# Patient Record
Sex: Male | Born: 1940 | Race: White | Hispanic: No | Marital: Married | Smoking: Former smoker
Health system: Southern US, Community
[De-identification: ages and names within clinical notes are randomized; demographics above are authoritative.]

## PROBLEM LIST (undated history)

## (undated) DIAGNOSIS — Z860100 Personal history of colon polyps, unspecified: Secondary | ICD-10-CM

## (undated) DIAGNOSIS — M109 Gout, unspecified: Secondary | ICD-10-CM

## (undated) DIAGNOSIS — E785 Hyperlipidemia, unspecified: Secondary | ICD-10-CM

## (undated) DIAGNOSIS — E119 Type 2 diabetes mellitus without complications: Secondary | ICD-10-CM

## (undated) DIAGNOSIS — I2510 Atherosclerotic heart disease of native coronary artery without angina pectoris: Secondary | ICD-10-CM

## (undated) DIAGNOSIS — I219 Acute myocardial infarction, unspecified: Secondary | ICD-10-CM

## (undated) DIAGNOSIS — Z9889 Other specified postprocedural states: Secondary | ICD-10-CM

## (undated) DIAGNOSIS — M1990 Unspecified osteoarthritis, unspecified site: Secondary | ICD-10-CM

## (undated) DIAGNOSIS — G4730 Sleep apnea, unspecified: Secondary | ICD-10-CM

## (undated) DIAGNOSIS — C609 Malignant neoplasm of penis, unspecified: Secondary | ICD-10-CM

## (undated) DIAGNOSIS — R7303 Prediabetes: Secondary | ICD-10-CM

## (undated) DIAGNOSIS — N139 Obstructive and reflux uropathy, unspecified: Secondary | ICD-10-CM

## (undated) DIAGNOSIS — J45909 Unspecified asthma, uncomplicated: Secondary | ICD-10-CM

## (undated) DIAGNOSIS — F419 Anxiety disorder, unspecified: Secondary | ICD-10-CM

## (undated) DIAGNOSIS — R0600 Dyspnea, unspecified: Secondary | ICD-10-CM

## (undated) DIAGNOSIS — D649 Anemia, unspecified: Secondary | ICD-10-CM

## (undated) DIAGNOSIS — J3489 Other specified disorders of nose and nasal sinuses: Secondary | ICD-10-CM

## (undated) DIAGNOSIS — M130 Polyarthritis, unspecified: Secondary | ICD-10-CM

## (undated) DIAGNOSIS — I10 Essential (primary) hypertension: Secondary | ICD-10-CM

## (undated) HISTORY — PX: CARPAL TUNNEL RELEASE: SHX101

## (undated) HISTORY — PX: COLONOSCOPY: SHX174

## (undated) HISTORY — PX: TONSILLECTOMY: SUR1361

## (undated) HISTORY — DX: Malignant neoplasm of penis, unspecified: C60.9

## (undated) HISTORY — PX: GANGLION CYST EXCISION: SHX1691

## (undated) HISTORY — DX: Type 2 diabetes mellitus without complications: E11.9

## (undated) HISTORY — PX: CARDIAC CATHETERIZATION: SHX172

## (undated) HISTORY — PX: ROTATOR CUFF REPAIR: SHX139

## (undated) HISTORY — PX: ESOPHAGOGASTRODUODENOSCOPY: SHX1529

## (undated) HISTORY — PX: APPENDECTOMY: SHX54

## (undated) NOTE — *Deleted (*Deleted)
11/30/2019  No chief complaint on file.    Jared Freeman is a 31 y.o. male with refractory urinary symptoms related to BPH who presents today to the office for cystoscopy and prostate sizing   Please see previous notes for details.     There were no vitals taken for this visit. NED. A&Ox3.   No respiratory distress   Abd soft, NT, ND Normal phallus with bilateral descended testicles    Cystoscopy Procedure Note  Patient identification was confirmed, informed consent was obtained, and patient was prepped using Betadine solution.  Lidocaine jelly was administered per urethral meatus.    Preoperative abx where received prior to procedure.     Pre-Procedure: - Inspection reveals a normal caliber ureteral meatus.  Procedure: The flexible cystoscope was introduced without difficulty - No urethral strictures/lesions are present. - {Blank multiple:19197::"Enlarged","Surgically absent","Normal"} prostate *** - {Blank multiple:19197::"Normal","Elevated","Tight"} bladder neck - Bilateral ureteral orifices identified - Bladder mucosa  reveals no ulcers, tumors, or lesions - No bladder stones - No trabeculation  Retroflexion shows ***   Post-Procedure: - Patient tolerated the procedure well   Prostate transrectal ultrasound sizing   Informed consent was obtained after discussing risks/benefits of the procedure.  A time out was performed to ensure correct patient identity.   Pre-Procedure: -Transrectal probe was placed without difficulty -Transrectal Ultrasound performed revealing a *** gm prostate measuring *** x *** x *** cm (length) -No significant hypoechoic or median lobe noted      Assessment/ Plan:   I, Theador Hawthorne, am acting as a scribe for Dr. Vanna Scotland.  {Add Production assistant, radio Statement}

---

## 2004-01-19 ENCOUNTER — Ambulatory Visit: Payer: Self-pay | Admitting: Orthopedic Surgery

## 2004-02-16 ENCOUNTER — Ambulatory Visit: Payer: Self-pay | Admitting: Unknown Physician Specialty

## 2004-10-26 ENCOUNTER — Ambulatory Visit: Payer: Self-pay | Admitting: General Practice

## 2006-12-17 ENCOUNTER — Ambulatory Visit: Payer: Self-pay | Admitting: Orthopedic Surgery

## 2006-12-17 ENCOUNTER — Other Ambulatory Visit: Payer: Self-pay

## 2006-12-24 ENCOUNTER — Ambulatory Visit: Payer: Self-pay | Admitting: Orthopedic Surgery

## 2008-01-11 ENCOUNTER — Ambulatory Visit: Payer: Self-pay | Admitting: Unknown Physician Specialty

## 2009-03-31 ENCOUNTER — Ambulatory Visit: Payer: Self-pay | Admitting: Unknown Physician Specialty

## 2011-05-13 ENCOUNTER — Inpatient Hospital Stay: Payer: Self-pay | Admitting: Surgery

## 2011-05-13 LAB — URINALYSIS, COMPLETE
Blood: NEGATIVE
Hyaline Cast: 1
Ketone: NEGATIVE
Leukocyte Esterase: NEGATIVE
Protein: NEGATIVE
RBC,UR: 1 /HPF (ref 0–5)
Squamous Epithelial: NONE SEEN

## 2011-05-13 LAB — COMPREHENSIVE METABOLIC PANEL
Albumin: 4.4 g/dL (ref 3.4–5.0)
Alkaline Phosphatase: 71 U/L (ref 50–136)
BUN: 17 mg/dL (ref 7–18)
Bilirubin,Total: 0.8 mg/dL (ref 0.2–1.0)
Chloride: 105 mmol/L (ref 98–107)
Co2: 27 mmol/L (ref 21–32)
Creatinine: 1.32 mg/dL — ABNORMAL HIGH (ref 0.60–1.30)
EGFR (African American): >60
EGFR (Non-African Amer.): 57 — ABNORMAL LOW
Glucose: 134 mg/dL — ABNORMAL HIGH (ref 65–99)
Osmolality: 288 (ref 275–301)
Potassium: 4.1 mmol/L (ref 3.5–5.1)
SGOT(AST): 27 U/L (ref 15–37)
SGPT (ALT): 30 U/L
Sodium: 143 mmol/L (ref 136–145)
Total Protein: 7.9 g/dL (ref 6.4–8.2)

## 2011-05-13 LAB — PROTIME-INR: Prothrombin Time: 13.8 secs (ref 11.5–14.7)

## 2011-05-13 LAB — CBC
HCT: 50.9 % (ref 40.0–52.0)
HGB: 16.9 g/dL (ref 13.0–18.0)
MCH: 32.5 pg (ref 26.0–34.0)
MCHC: 33.1 g/dL (ref 32.0–36.0)
MCV: 98 fL (ref 80–100)
RBC: 5.18 10*6/uL (ref 4.40–5.90)
RDW: 15.2 % — ABNORMAL HIGH (ref 11.5–14.5)
WBC: 14.4 10*3/uL — ABNORMAL HIGH (ref 3.8–10.6)

## 2011-05-14 LAB — BASIC METABOLIC PANEL
EGFR (Non-African Amer.): 47 — ABNORMAL LOW
Glucose: 260 mg/dL — ABNORMAL HIGH (ref 65–99)
Osmolality: 290 (ref 275–301)
Potassium: 4.4 mmol/L (ref 3.5–5.1)
Sodium: 140 mmol/L (ref 136–145)

## 2011-05-14 LAB — CBC WITH DIFFERENTIAL/PLATELET
Basophil %: 0 %
Eosinophil #: 0 10*3/uL (ref 0.0–0.7)
Eosinophil %: 0 %
HCT: 42.5 % (ref 40.0–52.0)
HGB: 13.9 g/dL (ref 13.0–18.0)
Lymphocyte #: 0.4 10*3/uL — ABNORMAL LOW (ref 1.0–3.6)
Lymphocyte %: 3.2 %
MCH: 32.2 pg (ref 26.0–34.0)
MCHC: 32.7 g/dL (ref 32.0–36.0)
MCV: 98 fL (ref 80–100)
Monocyte #: 0.3 10*3/uL (ref 0.0–0.7)
Neutrophil #: 12.6 10*3/uL — ABNORMAL HIGH (ref 1.4–6.5)
Neutrophil %: 94.4 %
RBC: 4.32 10*6/uL — ABNORMAL LOW (ref 4.40–5.90)
WBC: 13.4 10*3/uL — ABNORMAL HIGH (ref 3.8–10.6)

## 2011-05-15 LAB — CBC WITH DIFFERENTIAL/PLATELET
Basophil #: 0 10*3/uL (ref 0.0–0.1)
Basophil %: 0 %
Eosinophil #: 0 10*3/uL (ref 0.0–0.7)
Eosinophil %: 0 %
HCT: 41.4 % (ref 40.0–52.0)
Lymphocyte %: 9.3 %
MCH: 32.3 pg (ref 26.0–34.0)
MCV: 98 fL (ref 80–100)
Monocyte #: 0.8 10*3/uL — ABNORMAL HIGH (ref 0.0–0.7)
Monocyte %: 6.4 %
Neutrophil %: 84.3 %
WBC: 12.1 10*3/uL — ABNORMAL HIGH (ref 3.8–10.6)

## 2011-05-30 ENCOUNTER — Ambulatory Visit: Payer: Self-pay | Admitting: Family Medicine

## 2012-02-12 DIAGNOSIS — C609 Malignant neoplasm of penis, unspecified: Secondary | ICD-10-CM

## 2012-02-12 HISTORY — DX: Malignant neoplasm of penis, unspecified: C60.9

## 2012-02-12 HISTORY — PX: PENILE BIOPSY: SHX6013

## 2012-10-27 ENCOUNTER — Ambulatory Visit: Payer: Self-pay | Admitting: Cardiology

## 2013-01-01 DIAGNOSIS — C609 Malignant neoplasm of penis, unspecified: Secondary | ICD-10-CM | POA: Insufficient documentation

## 2013-08-23 ENCOUNTER — Ambulatory Visit: Payer: Self-pay

## 2013-08-23 LAB — RAPID STREP-A WITH REFLX: MICRO TEXT REPORT: NEGATIVE

## 2013-08-25 LAB — BETA STREP CULTURE(ARMC)

## 2013-08-27 DIAGNOSIS — G4730 Sleep apnea, unspecified: Secondary | ICD-10-CM | POA: Insufficient documentation

## 2013-08-27 DIAGNOSIS — Z86010 Personal history of colonic polyps: Secondary | ICD-10-CM | POA: Insufficient documentation

## 2013-08-27 DIAGNOSIS — M109 Gout, unspecified: Secondary | ICD-10-CM | POA: Insufficient documentation

## 2013-08-27 DIAGNOSIS — N139 Obstructive and reflux uropathy, unspecified: Secondary | ICD-10-CM | POA: Insufficient documentation

## 2013-08-27 DIAGNOSIS — Z8639 Personal history of other endocrine, nutritional and metabolic disease: Secondary | ICD-10-CM | POA: Insufficient documentation

## 2013-08-27 DIAGNOSIS — I739 Peripheral vascular disease, unspecified: Secondary | ICD-10-CM | POA: Insufficient documentation

## 2013-08-27 DIAGNOSIS — E669 Obesity, unspecified: Secondary | ICD-10-CM | POA: Insufficient documentation

## 2013-08-27 DIAGNOSIS — E119 Type 2 diabetes mellitus without complications: Secondary | ICD-10-CM

## 2013-08-27 DIAGNOSIS — I493 Ventricular premature depolarization: Secondary | ICD-10-CM | POA: Insufficient documentation

## 2013-08-27 DIAGNOSIS — Z8679 Personal history of other diseases of the circulatory system: Secondary | ICD-10-CM | POA: Insufficient documentation

## 2013-08-27 DIAGNOSIS — I10 Essential (primary) hypertension: Secondary | ICD-10-CM | POA: Insufficient documentation

## 2013-08-27 DIAGNOSIS — Z9889 Other specified postprocedural states: Secondary | ICD-10-CM | POA: Insufficient documentation

## 2013-08-27 HISTORY — DX: Type 2 diabetes mellitus without complications: E11.9

## 2013-11-01 ENCOUNTER — Ambulatory Visit: Payer: Self-pay | Admitting: Physician Assistant

## 2013-12-28 ENCOUNTER — Ambulatory Visit: Payer: Self-pay | Admitting: Internal Medicine

## 2014-02-13 ENCOUNTER — Ambulatory Visit: Payer: Self-pay | Admitting: Internal Medicine

## 2014-02-20 ENCOUNTER — Ambulatory Visit: Payer: Self-pay | Admitting: Physician Assistant

## 2014-04-14 ENCOUNTER — Ambulatory Visit: Payer: Self-pay | Admitting: Vascular Surgery

## 2014-06-05 NOTE — Op Note (Signed)
PATIENT NAME:  Jared Freeman, Jared Freeman MR#:  [id number] DATE OF BIRTH:  [birth year]  DATE OF PROCEDURE:  05/13/2011  PREOPERATIVE DIAGNOSIS: Acute appendicitis.   POSTOPERATIVE DIAGNOSIS: Acute gangrenous appendicitis.   PROCEDURE: Laparoscopic appendectomy.   SURGEON: Phoebe Perch, M.D.   ANESTHESIA: General with endotracheal tube - Dr. Kayleen Memos.   INDICATIONS: This is a patient with probable acute appendicitis. Jared Freeman has right upper quadrant pain, CT findings suggestive of appendicitis, and a leukocytosis. Jared Freeman also has signs of peritoneal irritation. Preoperatively we discussed rationale for surgery, the options of observation, risk of bleeding, infection, recurrence of symptoms, failure to resolve the symptoms, negative laparoscopy and conversion to an open procedure as well as bowel injury. This was all reviewed for Jared Freeman and his family in the preop holding area. They understood and agreed to proceed.   FINDINGS: Acute gangrenous appendicitis in a retrocecal position high in the right upper quadrant. His morbid obesity made his dissection very difficult necessitating a fourth port, in the right upper quadrant.   DESCRIPTION OF PROCEDURE: The patient was induced to general anesthesia and given IV antibiotics. VTE prophylaxis was in place. Jared Freeman was prepped and draped in a sterile fashion. Marcaine was infiltrated in the skin and subcutaneous tissues around the infraumbilical area avoiding the umbilical hernia and a Veress needle was placed. Pneumoperitoneum was obtained. A 5 mm trocar port was placed. The abdominal cavity was explored and under direct vision a 5 mm suprapubic port and a 13 mm left lateral port was placed, all under direct vision. An attempt at reaching the appendix was performed and search for the appendix was performed showing that there was fibropurulent exudate in the right upper quadrant. The left lateral port site would barely reach the right upper quadrant and dissection necessitated  placement of a fourth 5 mm port, in the right upper quadrant for retraction. The appendix was ultimately identified in a somewhat retrocecal position. It was gangrenous and purulent. Photos were taken of it exuding its purulence prior to much manipulation. The base of the appendix was identified and handled with an Endo GIA standard load and then the mesoappendix was elevated and with some difficulty it was handled with two firings of a vascular load Endo GIA. The specimen was passed out through the lateral port site with the aid of an Endo Catch bag. The area was checked for hemostasis. The staple line seemed to be bleeding in one area and a small arterial bleed on the staple line was handled with double clips.   The area was checked for hemostasis and found to be adequate. There was no sign of bowel injury and there were no signs of adhesions. Irrigation was performed and aspiration was performed. Again, hemostasis was adequate.   The patient, because of this difficult location and his morbid obesity, required extraordinary positioning head down, left lateral manipulations in order to perform this difficult dissection.  Ultimately there was no sign of bleeding. The left lateral port site was then closed with multiple simple sutures of 0 Vicryl utilizing an Endo Close technique. Hemostasis was again checked at the end of the procedure and found to be adequate. The sponge, lap and needle counts were correct. Pneumoperitoneum was released. All ports were removed. 4-0 subcuticular Monocryl was used on all skin edges. Steri-Strips, Mastisol, and sterile dressings were placed. A total of 30 mL of Marcaine was utilized. The patient tolerated this procedure well. Jared Freeman was taken to the recovery room in stable condition to be admitted for  continued care. ____________________________ Jerrol Banana. Burt Knack, MD rec:slb D: 05/13/2011 18:02:03 ET T: 05/14/2011 09:50:33 ET JOB#: [id number]  cc: Jerrol Banana. Burt Knack, MD,  <Dictator> Florene Glen MD ELECTRONICALLY SIGNED 05/14/2011 12:31

## 2014-06-05 NOTE — H&P (Signed)
Subjective/Chief Complaint rlq pain    History of Present Illness 24 hrs rlq pain with nausea, no emesis no f/c no prior episode, worsening    Past History CAD, MI, stents, not on Plavix last stress test 4 years ago gout, hypercholesterol PSH, hand ortho no abd surgeries    Past Medical Health Coronary Artery Disease, Hypertension   Past Med/Surgical Hx:  Hypercholesterolemia:   gout:   MI:   HTN:   ALLERGIES:  No Known Allergies:   Family and Social History:   Family History Non-Contributory    Social History negative tobacco, negative ETOH, drives    Place of Living Home   Review of Systems:   Fever/Chills No    Cough No    Abdominal Pain Yes    Diarrhea No    Constipation No    Nausea/Vomiting No    SOB/DOE No    Chest Pain No    Dysuria No    Tolerating Diet Yes   Physical Exam:   GEN NAD    HEENT pink conjunctivae    NECK supple    RESP normal resp effort  clear BS    CARD regular rate    ABD positive tenderness  soft  RLQ, pos Rossving's    LYMPH negative neck    PSYCH alert, A+O to time, place, person, good insight   Routine Hem:  01-Apr-13 11:21    WBC (CBC) 14.4   RBC (CBC) 5.18   Hemoglobin (CBC) 16.9   Hematocrit (CBC) 50.9   Platelet Count (CBC) 201   MCV 98   MCH 32.5   MCHC 33.1   RDW 15.2  Routine Chem:  01-Apr-13 11:21    Glucose, Serum 134   BUN 17   Creatinine (comp) 1.32   Sodium, Serum 143   Potassium, Serum 4.1   Chloride, Serum 105   CO2, Serum 27   Calcium (Total), Serum 9.1  Hepatic:  01-Apr-13 11:21    Bilirubin, Total 0.8   Alkaline Phosphatase 71   SGPT (ALT) 30   SGOT (AST) 27   Total Protein, Serum 7.9   Albumin, Serum 4.4  Routine Chem:  01-Apr-13 11:21    Osmolality (calc) 288   eGFR (African American) >60   eGFR (Non-African American) 57   Anion Gap 11   Lipase 538  Routine UA:  01-Apr-13 11:21    Color (UA) Yellow   Clarity (UA) Clear   Glucose (UA) Negative   Bilirubin  (UA) Negative   Ketones (UA) Negative   Specific Gravity (UA) 1.019   Blood (UA) Negative   pH (UA) 5.0   Protein (UA) Negative   Nitrite (UA) Negative   Leukocyte Esterase (UA) Negative   RBC (UA) 1 /HPF   WBC (UA) <1 /HPF   Mucous (UA) PRESENT   Hyaline Cast (UA) 1 /LPF  Routine Coag:  01-Apr-13 11:21    Prothrombin 13.8   INR 1.0   Radiology Results: CT:    01-Apr-13 15:04, CT Abdomen and Pelvis With Contrast   CT Abdomen and Pelvis With Contrast   REASON FOR EXAM:    (1) RLQ pain, leukocytosis; (2) RLQ pain,   leukocytosis;    NOTE: Nursing to Give  COMMENTS:       PROCEDURE: CT  - CT ABDOMEN / PELVIS  W  - May 13 2011  3:04PM     RESULT: Is performed with 100 mL of Isovue-300 iodinated intravenous   contrast and oral  contrast. Images are reconstructed in the axial plane   at 3.0 mm slice thickness. The patient has no previous exam for   comparison.    There is an abnormal appearance of the appendix with the diameter   measuring 1.39 cm and periappendiceal stranding present. Findings are   concerning for acute appendicitis are dominantly in the base to   midportion of the appendix. This is above the level of the iliac crest     laterally along the abdominal wall. There is no evidence of perforation   or abscess formation.    Colonic diverticular disease is present most prominently in the distal   ascending colon and sigmoid colon. There is no evidence of acute   diverticulitis. No ascites is evident in the pelvis. There is a moderate   amount of urine in the bladder. Oral contrast has not reached the colon   at the time of imaging. Contrast is seen in the distal esophagus, stomach   and small bowel. There may be some gastroesophageal reflux but there is   no significant hernia evident. The kidneys show no obstruction or   nephrolithiasis. Atherosclerotic calcification is seen within the   abdominal aorta without evidence of an aneurysm. There is slight    thickening of the proximal duodenum. Correlate for duodenitis. No   radiopaque gallstones are evident. The liver, spleen, pancreas and   adrenal glands appear unremarkable. The lung bases appear to show     dependent atelectasis bilaterally in the lower lobe regions. There is no   interstitial edema. Degenerative changes are present in the spine most   pronounced at L4-L5 with very severe degenerative disc narrowing with   vacuum disc changes and inferior L4 and superior L5 endplate sclerosis   and spurring 3    IMPRESSION:   1. Findings of acute appendicitis.  2. Cannot exclude duodenitis. Correlate clinically.  3. The sigmoid and distal descending colon diverticulosis without   evidence of acute diverticulitis.  4. Not mentioned above is a small fat filled umbilical hernia.  5. Atherosclerotic disease.(*)        Verified By: Sundra Aland, M.D., MD     Assessment/Admission Diagnosis ac appendicitis rec lap appy risks/options rev'd agrees to proceed   Electronic Signatures: Florene Glen (MD)  (Signed 01-Apr-13 16:03)  Authored: CHIEF COMPLAINT and HISTORY, PAST MEDICAL/SURGIAL HISTORY, ALLERGIES, FAMILY AND SOCIAL HISTORY, REVIEW OF SYSTEMS, PHYSICAL EXAM, LABS, Radiology, ASSESSMENT AND PLAN   Last Updated: 01-Apr-13 16:03 by Florene Glen (MD)

## 2014-06-05 NOTE — Discharge Summary (Signed)
PATIENT NAME:  Jared Freeman, Jared Freeman MR#:  [id number] DATE OF BIRTH:  05-01-1940  DATE OF ADMISSION:  05/13/2011 DATE OF DISCHARGE:  05/15/2011  DISCHARGE DIAGNOSES:  1. Gangrenous appendicitis.  2. Coronary artery disease. 3. Gout. 4. Hypercholesterolemia.   PROCEDURE: Laparoscopic appendectomy.   HISTORY OF PRESENT ILLNESS AND HOSPITAL COURSE: This is a patient who was admitted to the hospital with right flank pain, right lateral abdominal pain with CT findings and physical findings suggestive of acute appendicitis. In the operating room gangrenous appendicitis was identified in the right upper quadrant somewhat retrocecal. It required the placement of a fourth port for removal. Postoperatively he did well, is tolerating a regular diet. His Foley catheter has been removed. He is afebrile and will be discharged in stable condition to follow-up in my office in 10 days. He is given Vicodin with instructions to resume any preop medications with the exception of steroids that he was given a prescription for by Urgent Care for bronchitis. I have asked him not to take the steroids without consulting his physician again as to the need for that in the postoperative period.   ____________________________ Jerrol Banana Burt Knack, MD rec:drc D: 05/15/2011 08:26:03 ET T: 05/15/2011 14:01:09 ET JOB#: [id number]  cc: Jerrol Banana. Burt Knack, MD, <Dictator> Florene Glen MD ELECTRONICALLY SIGNED 05/15/2011 17:26

## 2014-06-05 NOTE — H&P (Signed)
PATIENT NAME:  Jared Freeman, Jared Freeman MR#:  [id number] DATE OF BIRTH:  October 22, 1940  DATE OF ADMISSION:  05/13/2011  CHIEF COMPLAINT: Right lower quadrant pain.   HISTORY OF PRESENT ILLNESS: This is a patient with approximately 24 hours of right lower quadrant pain. He stated that it started at 16:30 hours yesterday, and was doing fine without any problems prior to that. He has had no emesis, no nausea. He has not had fevers or chills and he has never had an episode like this before. He points to the right lateral side of his abdomen stating that it is worsening.    PAST MEDICAL HISTORY:  1. Coronary artery disease with a myocardial infarction in the remote past. He had stents placed many years ago. He is not on Plavix and he had a normal stress test four years ago.  2. Gout. 3. Hypercholesterolemia.   PAST SURGICAL HISTORY:  Orthopedic surgeries. Denies abdominal surgery.   ALLERGIES: None.   MEDICATIONS: Multiple, see chart.   FAMILY HISTORY: Noncontributory.   SOCIAL HISTORY: He does not smoke or drink. 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   REVIEW OF SYSTEMS: 10-system review has been performed and negative with the exception of that mentioned in the history of present illness.   PHYSICAL EXAMINATION:  GENERAL: Healthy but obese Caucasian male patient. BMI is 36, height of 66 inches, weight 223.   VITALS: Temperature 98.2, pulse 85, respirations 18, blood pressure 137/79, pain scale 9, 93% room air sat.   HEENT: No scleral icterus.   NECK: No palpable neck nodes.   CHEST: Clear to auscultation.   CARDIAC: Regular rate and rhythm.   ABDOMEN: Abdomen is soft but there is tenderness in the right lateral abdomen somewhat above McBurney's point with guarding and rebound on the right side and a positive Rovsing's sign.   GU: Exam is within normal limits.   EXTREMITIES: Without edema. Calves are nontender.   NEUROLOGIC: Grossly intact.   INTEGUMENT: No jaundice.   LABORATORY  AND RADIOLOGICAL DATA: CT scan suggests appendicitis and will be personally reviewed. Hemoglobin and hematocrit is normal. White blood cell count 14.4 and a platelet count 201. Electrolytes are within normal limits. Creatinine is 1.32.   ASSESSMENT AND PLAN: This is a patient with coronary artery disease who presents with right lower quadrant/right flank pain and physical findings and CT findings of appendicitis. He will require emergent laparoscopic appendectomy. The rationale for offering surgery has been discussed with him and his family. The options of observation have been reviewed, and the risks of bleeding, infection, recurrence, negative laparoscopy, and conversion to an open procedure have all been reviewed. He understood and agreed to proceed.  ____________________________ Jerrol Banana Burt Knack, MD rec:bjt D:  05/14/2011 49:17:91 ET          T: 05/14/2011 09:15:18 ET         JOB#: [id number]  cc: Jerrol Banana. Burt Knack, MD, <Dictator> Florene Glen MD ELECTRONICALLY SIGNED 05/14/2011 9:55

## 2014-09-30 ENCOUNTER — Encounter: Payer: Self-pay | Admitting: *Deleted

## 2014-09-30 DIAGNOSIS — M12811 Other specific arthropathies, not elsewhere classified, right shoulder: Secondary | ICD-10-CM | POA: Insufficient documentation

## 2014-09-30 DIAGNOSIS — M75101 Unspecified rotator cuff tear or rupture of right shoulder, not specified as traumatic: Secondary | ICD-10-CM

## 2014-10-03 ENCOUNTER — Encounter
Admission: RE | Disposition: A | Discharge status: Home / Self Care | Payer: Self-pay | Source: Ambulatory Visit | Attending: Unknown Physician Specialty

## 2014-10-03 ENCOUNTER — Ambulatory Visit: Payer: BLUE CROSS/BLUE SHIELD | Admitting: Anesthesiology

## 2014-10-03 ENCOUNTER — Ambulatory Visit
Admission: RE | Admit: 2014-10-03 | Discharge: 2014-10-03 | Disposition: A | Discharge status: Home / Self Care | Payer: BLUE CROSS/BLUE SHIELD | Source: Ambulatory Visit | Attending: Unknown Physician Specialty | Admitting: Unknown Physician Specialty

## 2014-10-03 ENCOUNTER — Encounter: Payer: Self-pay | Admitting: *Deleted

## 2014-10-03 DIAGNOSIS — Z1211 Encounter for screening for malignant neoplasm of colon: Secondary | ICD-10-CM | POA: Diagnosis present

## 2014-10-03 DIAGNOSIS — Z79899 Other long term (current) drug therapy: Secondary | ICD-10-CM | POA: Diagnosis not present

## 2014-10-03 DIAGNOSIS — I252 Old myocardial infarction: Secondary | ICD-10-CM | POA: Insufficient documentation

## 2014-10-03 DIAGNOSIS — I2510 Atherosclerotic heart disease of native coronary artery without angina pectoris: Secondary | ICD-10-CM | POA: Diagnosis not present

## 2014-10-03 DIAGNOSIS — M109 Gout, unspecified: Secondary | ICD-10-CM | POA: Diagnosis not present

## 2014-10-03 DIAGNOSIS — K5730 Diverticulosis of large intestine without perforation or abscess without bleeding: Secondary | ICD-10-CM | POA: Insufficient documentation

## 2014-10-03 DIAGNOSIS — Z951 Presence of aortocoronary bypass graft: Secondary | ICD-10-CM | POA: Diagnosis not present

## 2014-10-03 DIAGNOSIS — G4730 Sleep apnea, unspecified: Secondary | ICD-10-CM | POA: Insufficient documentation

## 2014-10-03 DIAGNOSIS — Z86010 Personal history of colonic polyps: Secondary | ICD-10-CM | POA: Insufficient documentation

## 2014-10-03 DIAGNOSIS — E785 Hyperlipidemia, unspecified: Secondary | ICD-10-CM | POA: Insufficient documentation

## 2014-10-03 DIAGNOSIS — D759 Disease of blood and blood-forming organs, unspecified: Secondary | ICD-10-CM | POA: Insufficient documentation

## 2014-10-03 DIAGNOSIS — I10 Essential (primary) hypertension: Secondary | ICD-10-CM | POA: Insufficient documentation

## 2014-10-03 DIAGNOSIS — K648 Other hemorrhoids: Secondary | ICD-10-CM | POA: Insufficient documentation

## 2014-10-03 DIAGNOSIS — J45909 Unspecified asthma, uncomplicated: Secondary | ICD-10-CM | POA: Insufficient documentation

## 2014-10-03 DIAGNOSIS — Z87891 Personal history of nicotine dependence: Secondary | ICD-10-CM | POA: Diagnosis not present

## 2014-10-03 HISTORY — DX: Unspecified asthma, uncomplicated: J45.909

## 2014-10-03 HISTORY — DX: Personal history of colonic polyps: Z86.010

## 2014-10-03 HISTORY — DX: Essential (primary) hypertension: I10

## 2014-10-03 HISTORY — DX: Sleep apnea, unspecified: G47.30

## 2014-10-03 HISTORY — DX: Gout, unspecified: M10.9

## 2014-10-03 HISTORY — DX: Acute myocardial infarction, unspecified: I21.9

## 2014-10-03 HISTORY — DX: Obstructive and reflux uropathy, unspecified: N13.9

## 2014-10-03 HISTORY — DX: Other specified postprocedural states: Z98.890

## 2014-10-03 HISTORY — PX: COLONOSCOPY WITH PROPOFOL: SHX5780

## 2014-10-03 HISTORY — DX: Anemia, unspecified: D64.9

## 2014-10-03 HISTORY — DX: Personal history of colon polyps, unspecified: Z86.0100

## 2014-10-03 HISTORY — DX: Atherosclerotic heart disease of native coronary artery without angina pectoris: I25.10

## 2014-10-03 HISTORY — DX: Unspecified osteoarthritis, unspecified site: M19.90

## 2014-10-03 HISTORY — DX: Hyperlipidemia, unspecified: E78.5

## 2014-10-03 HISTORY — DX: Other specified disorders of nose and nasal sinuses: J34.89

## 2014-10-03 HISTORY — DX: Polyarthritis, unspecified: M13.0

## 2014-10-03 SURGERY — COLONOSCOPY WITH PROPOFOL
Anesthesia: General

## 2014-10-03 MED ORDER — SODIUM CHLORIDE 0.9 % IV SOLN
INTRAVENOUS | Status: DC
  Administered 2014-10-03: 1000 mL via INTRAVENOUS

## 2014-10-03 MED ORDER — SODIUM CHLORIDE 0.9 % IV SOLN
INTRAVENOUS | Status: DC | PRN
  Administered 2014-10-03: 09:00:00 via INTRAVENOUS

## 2014-10-03 MED ORDER — PROPOFOL 10 MG/ML IV BOLUS
INTRAVENOUS | Status: DC | PRN
  Administered 2014-10-03: 50 mg via INTRAVENOUS

## 2014-10-03 MED ORDER — SODIUM CHLORIDE 0.9 % IV SOLN: INTRAVENOUS | Status: DC

## 2014-10-03 MED ORDER — PROPOFOL INFUSION 10 MG/ML OPTIME
INTRAVENOUS | Status: DC | PRN
  Administered 2014-10-03: 100 ug/kg/min via INTRAVENOUS

## 2014-10-03 NOTE — Op Note (Signed)
[facility] Gastroenterology Patient Name: Jared Freeman Procedure Date: 10/03/2014 8:56 AM MRN: [id number] Account #: [id number] Date of Birth: [birth year] Admit Type: Outpatient Age: 74 Room: [facility] ENDO ROOM 1 Gender: Male Note Status: Finalized Procedure:         Colonoscopy Indications:       Personal history of colonic polyps Providers:         Manya Silvas, MD Referring MD:      Leonie Douglas. Doy Hutching, MD (Referring MD) Medicines:         Propofol per Anesthesia Complications:     No immediate complications. Procedure:         Pre-Anesthesia Assessment:                    - After reviewing the risks and benefits, the patient was                     deemed in satisfactory condition to undergo the procedure.                    After obtaining informed consent, the colonoscope was                     passed under direct vision. Throughout the procedure, the                     patient's blood pressure, pulse, and oxygen saturations                     were monitored continuously. The Colonoscope was                     introduced through the anus and advanced to the the cecum,                     identified by appendiceal orifice and ileocecal valve. The                     colonoscopy was performed without difficulty. The patient                     tolerated the procedure well. The quality of the bowel                     preparation was excellent. Findings:      Multiple small-mouthed diverticula were found in the sigmoid colon and       in the descending colon.      Internal hemorrhoids were found during endoscopy. The hemorrhoids were       small and Grade I (internal hemorrhoids that do not prolapse).      The exam was otherwise without abnormality. Impression:        - Diverticulosis in the sigmoid colon and in the                     descending colon.                    - Internal hemorrhoids.                    - The examination was otherwise normal.                   - No specimens collected. Recommendation:    - Repeat colonoscopy  in 5 years for surveillance. Manya Silvas, MD 10/03/2014 9:15:36 AM This report has been signed electronically. Number of Addenda: 0 Note Initiated On: 10/03/2014 8:56 AM Scope Withdrawal Time: 0 hours 6 minutes 34 seconds  Total Procedure Duration: 0 hours 8 minutes 52 seconds       [facility]

## 2014-10-03 NOTE — Anesthesia Postprocedure Evaluation (Signed)
  Anesthesia Post-op Note  Patient: Jared Freeman  Procedure(s) Performed: Procedure(s): COLONOSCOPY WITH PROPOFOL (N/A)  Anesthesia type:General  Patient location: PACU  Post pain: Pain level controlled  Post assessment: Post-op Vital signs reviewed, Patient's Cardiovascular Status Stable, Respiratory Function Stable, Patent Airway and No signs of Nausea or vomiting  Post vital signs: Reviewed and stable  Last Vitals:  Filed Vitals:   10/03/14 0947  BP: 123/71  Pulse:   Temp:   Resp: 17    Level of consciousness: awake, alert  and patient cooperative  Complications: No apparent anesthesia complications

## 2014-10-03 NOTE — H&P (Signed)
   Primary Care Physician:  [facility], [place] Primary Gastroenterologist:  Dr. Vira Agar  Pre-Procedure History & Physical: HPI:  Jared Freeman is a 74 y.o. male is here for an colonoscopy.   Past Medical History  Diagnosis Date  . Hypertension   . Anemia   . Coronary artery disease   . Sleep apnea   . Asthma   . Arthritis   . Gout   . Hyperlipidemia   . Polyarthropathy   . Urinary outflow obstruction   . Myocardial infarction   . History of colon polyps   . Nasal septal defect   . S/P trigger finger release     bilateral    Past Surgical History  Procedure Laterality Date  . Tonsillectomy    . Rotator cuff repair    . Carpal tunnel release Bilateral   . Ganglion cyst excision    . Colonoscopy    . Esophagogastroduodenoscopy    . Appendectomy    . Cardiac catheterization      with stent    Prior to Admission medications   Medication Sig Start Date End Date Taking? Authorizing Provider  allopurinol (ZYLOPRIM) 300 MG tablet Take 300 mg by mouth daily.   Yes Historical Provider, MD  amLODipine-benazepril (LOTREL) 5-20 MG per capsule Take 1 capsule by mouth daily.   Yes Historical Provider, MD  aspirin 325 MG tablet Take 325 mg by mouth daily.   Yes Historical Provider, MD  hydroxychloroquine (PLAQUENIL) 200 MG tablet Take by mouth daily.   Yes Historical Provider, MD  rosuvastatin (CRESTOR) 5 MG tablet Take 5 mg by mouth daily.   Yes Historical Provider, MD  cilostazol (PLETAL) 100 MG tablet Take 100 mg by mouth 2 (two) times daily.    Historical Provider, MD    Allergies as of 08/10/2014  . (Not on File)    History reviewed. No pertinent family history.  Social History   Social History  . Marital Status: Married    Spouse Name: N/A  . Number of Children: N/A  . Years of Education: N/A   Occupational History  . Not on file.   Social History Main Topics  . Smoking status: Former Smoker    Types: Cigarettes  . Smokeless tobacco: Not on file  .  Alcohol Use: No  . Drug Use: Not on file  . Sexual Activity: Not on file   Other Topics Concern  . Not on file   Social History Narrative    Review of Systems: See HPI, otherwise negative ROS  Physical Exam: BP 115/81 mmHg  Pulse 63  Temp(Src) 97.2 F (36.2 C) (Tympanic)  Resp 18  Ht 5\' 5"  (1.651 m)  Wt 91.627 kg (202 lb)  BMI 33.61 kg/m2  SpO2 98% General:   Alert,  pleasant and cooperative in NAD Head:  Normocephalic and atraumatic. Neck:  Supple; no masses or thyromegaly. Lungs:  Clear throughout to auscultation.    Heart:  Regular rate and rhythm. Abdomen:  Soft, nontender and nondistended. Normal bowel sounds, without guarding, and without rebound.   Neurologic:  Alert and  oriented x4;  grossly normal neurologically.  Impression/Plan: Jared Freeman is here for an colonoscopy to be performed for personal history of colon polyps  Risks, benefits, limitations, and alternatives regarding  colonoscopy have been reviewed with the patient.  Questions have been answered.  All parties agreeable.   Gaylyn Cheers, MD  10/03/2014, 8:57 AM

## 2014-10-03 NOTE — Anesthesia Preprocedure Evaluation (Signed)
Anesthesia Evaluation  Patient identified by MRN, date of birth, ID band Patient awake    Reviewed: Allergy & Precautions, NPO status , Patient's Chart, lab work & pertinent test results  History of Anesthesia Complications Negative for: history of anesthetic complications  Airway Mallampati: II  TM Distance: >3 FB Neck ROM: Full    Dental  (+) Teeth Intact   Pulmonary asthma (as a child) , sleep apnea (not using now since weight loss) , former smoker (quit x 22 yrs),          Cardiovascular hypertension, Pt. on medications + CAD, + Past MI and + Cardiac Stents     Neuro/Psych    GI/Hepatic   Endo/Other  diabetes (diet controlled), Well Controlled  Renal/GU      Musculoskeletal   Abdominal   Peds  Hematology  (+) Blood dyscrasia (pt denies anemia), anemia ,   Anesthesia Other Findings   Reproductive/Obstetrics                             Anesthesia Physical Anesthesia Plan  ASA: III  Anesthesia Plan: General   Post-op Pain Management:    Induction: Intravenous  Airway Management Planned: Nasal Cannula  Additional Equipment:   Intra-op Plan:   Post-operative Plan:   Informed Consent: I have reviewed the patients History and Physical, chart, labs and discussed the procedure including the risks, benefits and alternatives for the proposed anesthesia with the patient or authorized representative who has indicated his/her understanding and acceptance.     Plan Discussed with:   Anesthesia Plan Comments:         Anesthesia Quick Evaluation

## 2014-10-03 NOTE — Transfer of Care (Signed)
Immediate Anesthesia Transfer of Care Note  Patient: Jared Freeman  Procedure(s) Performed: Procedure(s): COLONOSCOPY WITH PROPOFOL (N/A)  Patient Location: PACU  Anesthesia Type:General  Level of Consciousness: awake  Airway & Oxygen Therapy: Patient connected to nasal cannula oxygen  Post-op Assessment: Report given to RN  Post vital signs: stable  Last Vitals:  Filed Vitals:   10/03/14 0839  BP: 115/81  Pulse: 63  Temp: 36.2 C  Resp: 18    Complications: No apparent anesthesia complications

## 2014-10-05 ENCOUNTER — Encounter: Payer: Self-pay | Admitting: Unknown Physician Specialty

## 2015-01-16 ENCOUNTER — Other Ambulatory Visit: Payer: Self-pay | Admitting: Internal Medicine

## 2015-01-16 DIAGNOSIS — J411 Mucopurulent chronic bronchitis: Secondary | ICD-10-CM

## 2015-01-27 ENCOUNTER — Ambulatory Visit
Admission: RE | Admit: 2015-01-27 | Discharge: 2015-01-27 | Disposition: A | Discharge status: Home / Self Care | Payer: BLUE CROSS/BLUE SHIELD | Source: Ambulatory Visit | Attending: Internal Medicine | Admitting: Internal Medicine

## 2015-01-27 DIAGNOSIS — Z87891 Personal history of nicotine dependence: Secondary | ICD-10-CM | POA: Insufficient documentation

## 2015-01-27 DIAGNOSIS — J411 Mucopurulent chronic bronchitis: Secondary | ICD-10-CM

## 2015-01-27 DIAGNOSIS — I700 Atherosclerosis of aorta: Secondary | ICD-10-CM | POA: Insufficient documentation

## 2015-01-27 DIAGNOSIS — I2510 Atherosclerotic heart disease of native coronary artery without angina pectoris: Secondary | ICD-10-CM | POA: Insufficient documentation

## 2015-08-24 DIAGNOSIS — F411 Generalized anxiety disorder: Secondary | ICD-10-CM | POA: Insufficient documentation

## 2016-03-12 ENCOUNTER — Other Ambulatory Visit: Payer: Self-pay | Admitting: Rheumatology

## 2016-03-12 DIAGNOSIS — M545 Low back pain: Secondary | ICD-10-CM

## 2016-03-20 ENCOUNTER — Ambulatory Visit
Admission: RE | Admit: 2016-03-20 | Discharge: 2016-03-20 | Disposition: A | Discharge status: Home / Self Care | Payer: BLUE CROSS/BLUE SHIELD | Source: Ambulatory Visit | Attending: Rheumatology | Admitting: Rheumatology

## 2016-03-20 DIAGNOSIS — M545 Low back pain: Secondary | ICD-10-CM | POA: Diagnosis not present

## 2016-03-20 DIAGNOSIS — M5137 Other intervertebral disc degeneration, lumbosacral region: Secondary | ICD-10-CM | POA: Insufficient documentation

## 2016-03-20 DIAGNOSIS — M4807 Spinal stenosis, lumbosacral region: Secondary | ICD-10-CM | POA: Diagnosis not present

## 2016-03-20 DIAGNOSIS — M48061 Spinal stenosis, lumbar region without neurogenic claudication: Secondary | ICD-10-CM | POA: Diagnosis not present

## 2016-03-20 DIAGNOSIS — M5136 Other intervertebral disc degeneration, lumbar region: Secondary | ICD-10-CM | POA: Diagnosis not present

## 2016-09-19 ENCOUNTER — Ambulatory Visit: Payer: Medicare Other | Admitting: Physical Therapy

## 2016-09-26 ENCOUNTER — Ambulatory Visit: Payer: Medicare Other | Attending: Rheumatology | Admitting: Physical Therapy

## 2016-09-26 ENCOUNTER — Encounter: Payer: Self-pay | Admitting: Physical Therapy

## 2016-09-26 DIAGNOSIS — M25651 Stiffness of right hip, not elsewhere classified: Secondary | ICD-10-CM | POA: Diagnosis present

## 2016-09-26 DIAGNOSIS — M25551 Pain in right hip: Secondary | ICD-10-CM | POA: Diagnosis present

## 2016-09-26 DIAGNOSIS — M25552 Pain in left hip: Secondary | ICD-10-CM | POA: Insufficient documentation

## 2016-09-26 DIAGNOSIS — M25652 Stiffness of left hip, not elsewhere classified: Secondary | ICD-10-CM | POA: Insufficient documentation

## 2016-09-26 NOTE — Therapy (Signed)
[facility] [address], [place], [address] Phone: [phone]   Fax:  [phone]  Physical Therapy Evaluation  Patient Details  Name: Jared Freeman MRN: [id number] Date of Birth: 1940/04/28 Referring Provider: Meda Klinefelter, MD  Encounter Date: 09/26/2016      PT End of Session - 09/26/16 0951    Visit Number 1   Number of Visits 9   Date for PT Re-Evaluation 2016/11/26   Authorization Type G codes   Authorization Time Period 1/10   PT Start Time 0801   PT Stop Time 8250   PT Time Calculation (min) 54 min   Activity Tolerance Patient tolerated treatment well;No increased pain   Behavior During Therapy WFL for tasks assessed/performed      Past Medical History:  Diagnosis Date  . Anemia   . Arthritis   . Asthma   . Coronary artery disease   . Gout   . History of colon polyps   . Hyperlipidemia   . Hypertension   . Myocardial infarction ([place])   . Nasal septal defect   . Polyarthropathy   . S/P trigger finger release    bilateral  . Sleep apnea   . Urinary outflow obstruction     Past Surgical History:  Procedure Laterality Date  . APPENDECTOMY    . CARDIAC CATHETERIZATION     with stent  . CARPAL TUNNEL RELEASE Bilateral   . COLONOSCOPY    . COLONOSCOPY WITH PROPOFOL N/A 10/03/2014   Procedure: COLONOSCOPY WITH PROPOFOL;  Surgeon: Manya Silvas, MD;  Location: [facility] ENDOSCOPY;  Service: Endoscopy;  Laterality: N/A;  . ESOPHAGOGASTRODUODENOSCOPY    . GANGLION CYST EXCISION    . ROTATOR CUFF REPAIR    . TONSILLECTOMY      There were no vitals filed for this visit.       Subjective Assessment - 09/26/16 0824    Subjective Bil hip pain   Pertinent History Pt is a 76 y/o M who reports chronic Bil hip pain for ~6 months that occasionally radiates down to ankles Bil. Pt denies any back pain but reports his MD believes his hip pain is originating from his back.  Pt denies  any MOI. 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  Pt has no pain when mowing but has pain later in the day if he has been driving or mowing for a long time.   Pt has held off on injections but did receive prednisone ~2 months ago with significant relief per chart review.  Aggravating factors: standing, walking, ascending/descending steps, bending. Relieving factors: sitting, prednisone.  Worst pain: 8/10, current pain 6/10, best pain 0/10.  Pt reports he has had some tingling in the past [redacted] weeks along lateral lower R LE that lasts for ~1-2 hrs and then completely resolves which is not consistent with a specific activity.  Denies any changes in bowel, bladder.  Denies any night sweats, no change in weight.  Denies saddle paresthesia.  Pt reports his pain resolves completely after sitting for 2-3 minutes.  Onset of pain begins immediately when he begins walking.  No pain with static standing.  Pt reports at times he is painfree for several days.  When he was 76 y/o he sprained R ankle, and he broke one of  his ankles "a long time ago" but can't remember which one (did not have surgery for this).  No other previous injury either LE.  Pt reports instability in R ankle which causes him to fall at times as he finds himself falling to avoid twisting his ankle.  Pt sleeps with CPAP every night.  Sleeps on both sides, never sleeps on back or stomach.  Does not sleep with pillows between knees.  Pain is better in the morning and worsens as the day goes on, dependent on how active he is during the day. He has had several falls for "no reason".  Pt has had pain in posterior calves for the past 10 years which limits his ambulatory distance due to pain. He must rest before continuing on.  Pt reports h/o surgery in L hand with PT for several months after pulling a tendon. Pt has not received PT  for his current problem.     Limitations Walking;Other (comment)  ascneding/descending steps   How long can you sit comfortably? no limitation   How long can you stand comfortably? no limitation   How long can you walk comfortably? 5 seconds   Diagnostic tests MRI on 03/20/16 which revealed Mild central canal and subarticular recess narrowing at L4-5. There is also bilateral foraminal narrowing at this level, worse on the right, where it is moderate to moderately severe. Mild to moderate bilateral foraminal narrowing L5-S1 is worse on the right. Facet degenerative disease appearing worst at L4-5 and L5-S1.    Patient Stated Goals To decrease fear of falling.  To have no pain ascending/descending steps.     Currently in Pain? Yes   Pain Score 6    Pain Location Hip   Pain Orientation Left;Right   Pain Descriptors / Indicators Throbbing   Pain Type Chronic pain   Pain Onset More than a month ago   Pain Frequency Intermittent   Aggravating Factors  Walking, increased activity throughout day, bending   Pain Relieving Factors Sitting, prednisone   Effect of Pain on Daily Activities Up to 8/10 pain with activity, pain with ascending/descending steps   Multiple Pain Sites No            [facility] PT Assessment - 09/26/16 0806      Assessment   Medical Diagnosis Chronic low back pain, balance problems   Referring Provider Eugene Gavia Leeanne Mannan, MD   Onset Date/Surgical Date 03/29/16  Approximate   Hand Dominance Right   Next MD Visit Internal Medicine within the next 60 days.     Prior Therapy Yes, with success     Precautions   Precautions None     Restrictions   Weight Bearing Restrictions No     Balance Screen   Has the patient fallen in the past 6 months Yes   How many times? 3   Has the patient had a decrease in activity level because of a fear of falling?  No   Is the patient reluctant to leave their home because of a fear of falling?  No     Home Chiropractor Private residence   Living Arrangements Other relatives;Spouse/significant other  2 y/o grandson and daughter   Type of [place] to enter   Entrance Stairs-Number of Steps 3   Entrance Stairs-Rails Right   Home Layout One level   [place] - single point     Prior Function   Level  of Independence Independent   Vocation Full time employment   Vocation Requirements See subjective     Cognition   Overall Cognitive Status Within Functional Limits for tasks assessed      EXAMINATION   Outcome measures were completed and results explained to the patient:  mODI: 24%  FABQ (work): 9/42  FABQ (physical activity): 20/24    Palpation: TTP Bil glutes, increased muscular tension R lumbar paraspinals   Sensation: Dermatomes WNL BLEs   Reflexes: WNL BLEs   Posture: forward head posture   Gait Analysis: R lateral lean, dec weight shift to LLE and dec stance time LLE. Dec trunk rotation and arm swing Bil.   Repeated motions: Positive with repeated lumbar flexion with pain down to Bil ankles up to 4/10 pain, no N/T with this. Negative with repeated extension.   Mobility: hypomobile L2-5 with CPAs    Trunk AROM (L, R):  F: WNL but painful with repeated motions  E: WNL painfree  Rotation: WNL Bil painfree  Lateral F: WNL Bil painfree    Hip AROM in deg (L, R):  F: 99, 100  IR: 29, 30  ER: 42, 44    Hip PROM in deg (L, R):  F: 111, 114     Special Tests:  SLR Test: Negative Bil  FABER Test: Negative Bil  [place] Test: Negative Bil  SIJ Compression and Distraction Test: Negative  Slump Test: Negative Bil  Scour: Negative Bil  Hamstring length WNL BLE      TREATMENT  Educated pt in placing pillow between knees when sleeping in sidelying, pt verbalized understanding.  Grade III-IV L2-5 CPAs 2x30 seconds each level  STM Bil glute med and max         Objective measurements completed on examination: See above  findings.        PT Education - 09/26/16 [phone]    Education provided Yes   Education Details POC, role of PT, positioning when sleeping in sidelying with pillows   Person(s) Educated Patient   Methods Explanation;Demonstration   Comprehension Verbalized understanding;Returned demonstration;Need further instruction             PT Long Term Goals - 09/26/16 1119      PT LONG TERM GOAL #1   Title Pt will report worst pain 4/10 with ascending/descending steps, bending, etc. for improved QOL.   Baseline 09/26/16: Worst pain 8/10   Time 4   Period Weeks   Status New     PT LONG TERM GOAL #2   Title Pt's Bil hip IR will improve to WNL for decreased stiffness and pain   Baseline 09/26/16: L 29, R 30   Time 6   Period Weeks   Status New     PT LONG TERM GOAL #3   Title Pt will be able to complete work activities (driving, mowing) without pain following for improved QOL   Baseline Pain following increased activity during the day   Time 8   Period Weeks   Status New     PT LONG TERM GOAL #4   Title Pt will improve FABQ (physical activity) by at least 7 points to demonstrate improved pain with daily physical activities   Baseline 09/26/16: 20/24   Time 6   Period Weeks   Status New                Plan - 09/26/16 1127    Clinical Impression Statement Pt is a 76 y/o M who presents  with chronic Bil hip pain.  He has pain with daily physical activities and increased pain with increased physical activities during the day (i.e. mowing, driving).  His FABQ (physcail activity) score indicates his pain is limiting his ability to perform his daily physical activities.  He presents with decreased Bil hip IR AROM and hypomobility in lumbar spine.  His limited mobility and stiffness is likely the main contributor to his pain.  Pt reports occasional minimal nunbmness R lower leg but denies any additional neural components, will continue to monitor.  He presents with impaired gait  mechanics, likely a result of stiffness and pain.  The pt will likely benefit from skilled PT intervention for improved joint mobility, ROM, functional mobility, and QOL.    History and Personal Factors relevant to plan of care: Pt has h/o arthritis, RTC reapir, MI, gout which may effect pt's rehab potential   Clinical Presentation Stable   Clinical Presentation due to: Chronic pain which has remained the same over the course of the 6 months since onset.     Clinical Decision Making Low   Rehab Potential Good   Clinical Impairments Affecting Rehab Potential (+) success with PT in the past, pt motivated to decrease pain for ability to complete work duties painfree (-) Chronicity of pain, physical demands of job duties   PT Frequency 1x / week   PT Duration 8 weeks   PT Treatment/Interventions ADLs/Self Care Home Management;Aquatic Therapy;Cryotherapy;Electrical Stimulation;Iontophoresis 4mg /ml Dexamethasone;Moist Heat;Traction;Ultrasound;DME Instruction;Gait training;Stair training;Functional mobility training;Therapeutic activities;Therapeutic exercise;Balance training;Neuromuscular re-education;Patient/family education;Manual techniques;Passive range of motion;Dry needling;Energy conservation;Taping   PT Next Visit Plan Berg, STM Bil glutes, CPAs lumbar spine, assess Bil hip joint mobility and address as appropriate   PT Home Exercise Plan to be initated at next session.    Recommended Other Services None at this time   Consulted and Agree with Plan of Care Patient      Patient will benefit from skilled therapeutic intervention in order to improve the following deficits and impairments:  Abnormal gait, Decreased balance, Decreased mobility, Decreased range of motion, Difficulty walking, Hypomobility, Increased fascial restricitons, Impaired perceived functional ability, Impaired flexibility, Impaired sensation, Improper body mechanics, Postural dysfunction, Pain  Visit Diagnosis: Pain in left  hip  Pain in right hip  Stiffness of left hip, not elsewhere classified  Stiffness of right hip, not elsewhere classified      G-Codes - 10/19/16 0946    Functional Assessment Tool Used (Outpatient Only) mODI, FABQ, clinical judgement, ROM, strength, functional mobility   Functional Limitation Mobility: Walking and moving around   Mobility: Walking and Moving Around Current Status [phone]) At least 20 percent but less than 40 percent impaired, limited or restricted   Mobility: Walking and Moving Around Goal Status [phone]) At least 1 percent but less than 20 percent impaired, limited or restricted       Problem List There are no active problems to display for this patient.  Collie Siad PT, DPT Oct 19, 2016, 12:29 PM  [facility] [address], [place], [address] Phone: [phone]   Fax:  [phone]  Name: Jared Freeman MRN: [id number] Date of Birth: [birth year]

## 2016-09-27 ENCOUNTER — Encounter: Payer: Medicare Other | Admitting: Physical Therapy

## 2016-10-04 ENCOUNTER — Encounter: Payer: Self-pay | Admitting: Physical Therapy

## 2016-10-04 ENCOUNTER — Ambulatory Visit: Payer: Medicare Other | Admitting: Physical Therapy

## 2016-10-04 ENCOUNTER — Encounter: Payer: Medicare Other | Admitting: Physical Therapy

## 2016-10-04 VITALS — BP 110/74 | HR 65

## 2016-10-04 DIAGNOSIS — M25651 Stiffness of right hip, not elsewhere classified: Secondary | ICD-10-CM

## 2016-10-04 DIAGNOSIS — M25552 Pain in left hip: Secondary | ICD-10-CM

## 2016-10-04 DIAGNOSIS — M25551 Pain in right hip: Secondary | ICD-10-CM

## 2016-10-04 DIAGNOSIS — M25652 Stiffness of left hip, not elsewhere classified: Secondary | ICD-10-CM

## 2016-10-04 NOTE — Therapy (Signed)
[facility] [address], [place], [address] Phone: [phone]   Fax:  [phone]  Physical Therapy Treatment  Patient Details  Name: Jared Freeman MRN: [id number] Date of Birth: [birth year] Referring Provider: Meda Klinefelter, MD  Encounter Date: 10/04/2016      PT End of Session - 10/04/16 1040    Visit Number 2   Number of Visits 9   Date for PT Re-Evaluation Dec 09, 2016   Authorization Type G codes   Authorization Time Period 2/10   PT Start Time 1042   PT Stop Time 1127   PT Time Calculation (min) 45 min   Activity Tolerance Patient tolerated treatment well;No increased pain   Behavior During Therapy WFL for tasks assessed/performed      Past Medical History:  Diagnosis Date  . Anemia   . Arthritis   . Asthma   . Coronary artery disease   . Gout   . History of colon polyps   . Hyperlipidemia   . Hypertension   . Myocardial infarction ([place])   . Nasal septal defect   . Polyarthropathy   . S/P trigger finger release    bilateral  . Sleep apnea   . Urinary outflow obstruction     Past Surgical History:  Procedure Laterality Date  . APPENDECTOMY    . CARDIAC CATHETERIZATION     with stent  . CARPAL TUNNEL RELEASE Bilateral   . COLONOSCOPY    . COLONOSCOPY WITH PROPOFOL N/A 10/03/2014   Procedure: COLONOSCOPY WITH PROPOFOL;  Surgeon: Manya Silvas, MD;  Location: [facility] ENDOSCOPY;  Service: Endoscopy;  Laterality: N/A;  . ESOPHAGOGASTRODUODENOSCOPY    . GANGLION CYST EXCISION    . ROTATOR CUFF REPAIR    . TONSILLECTOMY      Vitals:   10/04/16 1052  BP: 110/74  Pulse: 65        Subjective Assessment - 10/04/16 1046    Subjective Pt reports since his last session his pain has improved and he is currently in no pain.  He attributes this to the STM at last session.  He continues to have pain with ascending/descending steps.  He said he was surprised to not have pain  today as he mowed for ~4 hrs yesterday.  He reports a large bruise appeared on his R forearm last week which has since resolved and last night he noticed significant swelling in R forearm which has improved since but is still swollen today.  Pt denies any injury to this area and does not remember being stung or bitten by anything.  This PT assessed the site and no warmth noted, no redness, pt denies any other symptoms, no pain with squeeze to site.  Instructed pt to monitor this area closely and if he notices any changes, redness, or warmth, to call his MD.  Pt verbalized understanding.     Pertinent History Pt is a 76 y/o M who reports chronic Bil hip pain for ~6 months that occasionally radiates down to ankles Bil. Pt denies any back pain but reports his MD believes his hip pain is originating from his back.  Pt denies any MOI. 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  Pt has no pain when mowing but has pain later in the day if he has been  driving or mowing for a long time.   Pt has held off on injections but did receive prednisone ~2 months ago with significant relief per chart review.  Aggravating factors: standing, walking, ascending/descending steps, bending. Relieving factors: sitting, prednisone.  Worst pain: 8/10, current pain 6/10, best pain 0/10.  Pt reports he has had some tingling in the past [redacted] weeks along lateral lower R LE that lasts for ~1-2 hrs and then completely resolves which is not consistent with a specific activity.  Denies any changes in bowel, bladder.  Denies any night sweats, no change in weight.  Denies saddle paresthesia.  Pt reports his pain resolves completely after sitting for 2-3 minutes.  Onset of pain begins immediately when he begins walking.  No pain with static standing.  Pt reports at times he is painfree for several days.  When he was 76  y/o he sprained R ankle, and he broke one of his ankles "a long time ago" but can't remember which one (did not have surgery for this).  No other previous injury either LE.  Pt reports instability in R ankle which causes him to fall at times as he finds himself falling to avoid twisting his ankle.  Pt sleeps with CPAP every night.  Sleeps on both sides, never sleeps on back or stomach.  Does not sleep with pillows between knees.  Pain is better in the morning and worsens as the day goes on, dependent on how active he is during the day. He has had several falls for "no reason".  Pt has had pain in posterior calves for the past 10 years which limits his ambulatory distance due to pain. He must rest before continuing on.  Pt reports h/o surgery in L hand with PT for several months after pulling a tendon. Pt has not received PT for his current problem.     Limitations Walking;Other (comment)  ascneding/descending steps   How long can you sit comfortably? no limitation   How long can you stand comfortably? no limitation   How long can you walk comfortably? 5 seconds   Diagnostic tests MRI on 03/20/16 which revealed Mild central canal and subarticular recess narrowing at L4-5. There is also bilateral foraminal narrowing at this level, worse on the right, where it is moderate to moderately severe. Mild to moderate bilateral foraminal narrowing L5-S1 is worse on the right. Facet degenerative disease appearing worst at L4-5 and L5-S1.    Patient Stated Goals To decrease fear of falling.  To have no pain ascending/descending steps.     Currently in Pain? No/denies   Pain Onset More than a month ago   Multiple Pain Sites No      TREATMENT   Hypomobile Bil hip joint mobility upon assessment   Pt scored 56/56 on the Berg Balance Test indicating pt is not at an increased risk of falling   Long axis distraction Bil hips 30 seconds x4 reps   STM Bil gluteal musculature   Lumbar CPA grade III-IV L3-5, 30 second  bouts, 4x each level   Supine Bil hip abduction isometrics with belt with 10 second holds x10   Assessed stair ascending/descending: Pt denies pain with this. Pt does not use railings. Noted poor eccentric control during descent with high impact.   Practiced ascending/descending steps with focus on controlling descent with quad musculature rather than high impact on joints. x4 without use of UE support, pt continues to deny pain.  [facility] Adult PT Treatment/Exercise - 10/04/16 1053      Balance   Balance Assessed Yes     Standardized Balance Assessment   Standardized Balance Assessment Berg Balance Test     Berg Balance Test   Sit to Stand Able to stand without using hands and stabilize independently   Standing Unsupported Able to stand safely 2 minutes   Sitting with Back Unsupported but Feet Supported on Floor or Stool Able to sit safely and securely 2 minutes   Stand to Sit Sits safely with minimal use of hands   Transfers Able to transfer safely, minor use of hands   Standing Unsupported with Eyes Closed Able to stand 10 seconds safely   Standing Ubsupported with Feet Together Able to place feet together independently and stand 1 minute safely   From Standing, Reach Forward with Outstretched Arm Can reach confidently >25 cm (10")   From Standing Position, Pick up Object from Floor Able to pick up shoe safely and easily   From Standing Position, Turn to Look Behind Over each Shoulder Looks behind from both sides and weight shifts well   Turn 360 Degrees Able to turn 360 degrees safely in 4 seconds or less   Standing Unsupported, Alternately Place Feet on Step/Stool Able to stand independently and safely and complete 8 steps in 20 seconds   Standing Unsupported, One Foot in Front Able to place foot tandem independently and hold 30 seconds   Standing on One Leg Able to lift leg independently and hold > 10 seconds   Total Score 56                PT Education -  10/04/16 1039    Education provided Yes   Education Details Exercise technique; to incoroporate new technique for ascneding/descending steps   Person(s) Educated Patient   Methods Explanation;Demonstration;Verbal cues   Comprehension Verbalized understanding;Verbal cues required;Returned demonstration;Need further instruction             PT Long Term Goals - 09/26/16 1119      PT LONG TERM GOAL #1   Title Pt will report worst pain 4/10 with ascending/descending steps, bending, etc. for improved QOL.   Baseline 09/26/16: Worst pain 8/10   Time 4   Period Weeks   Status New     PT LONG TERM GOAL #2   Title Pt's Bil hip IR will improve to WNL for decreased stiffness and pain   Baseline 09/26/16: L 29, R 30   Time 6   Period Weeks   Status New     PT LONG TERM GOAL #3   Title Pt will be able to complete work activities (driving, mowing) without pain following for improved QOL   Baseline Pain following increased activity during the day   Time 8   Period Weeks   Status New     PT LONG TERM GOAL #4   Title Pt will improve FABQ (physical activity) by at least 7 points to demonstrate improved pain with daily physical activities   Baseline 09/26/16: 20/24   Time 6   Period Weeks   Status New               Plan - 10/04/16 1134    Clinical Impression Statement Pt presents with improvement in Bil hip pain symptoms and denied pain throughout entire session.  Upon assessment, noted that pt demonstrates hypomobility Bil hips and initiated long axis distraction BLE.  Continued with STM to Bil gluteal  musculature with ease of muscular tension noted following.  Assessed pt ascending/descending steps and cued pt to control his descent to avoid high impact, pt to implement this as part of his HEP moving forward.  Pt has responded extremely well to therapeutic interventions thus far and will continue to benefit from skilled PT for further improvement in symptoms with all daily  activities.   Rehab Potential Good   Clinical Impairments Affecting Rehab Potential (+) success with PT in the past, pt motivated to decrease pain for ability to complete work duties painfree (-) Chronicity of pain, physical demands of job duties   PT Frequency 1x / week   PT Duration 8 weeks   PT Treatment/Interventions ADLs/Self Care Home Management;Aquatic Therapy;Cryotherapy;Electrical Stimulation;Iontophoresis 4mg /ml Dexamethasone;Moist Heat;Traction;Ultrasound;DME Instruction;Gait training;Stair training;Functional mobility training;Therapeutic activities;Therapeutic exercise;Balance training;Neuromuscular re-education;Patient/family education;Manual techniques;Passive range of motion;Dry needling;Energy conservation;Taping   PT Next Visit Plan Berg, STM Bil glutes, CPAs lumbar spine, assess Bil hip joint mobility and address as appropriate   PT Home Exercise Plan pt to implement new technique for descending steps   Recommended Other Services none at this time   Consulted and Agree with Plan of Care Patient      Patient will benefit from skilled therapeutic intervention in order to improve the following deficits and impairments:  Abnormal gait, Decreased balance, Decreased mobility, Decreased range of motion, Difficulty walking, Hypomobility, Increased fascial restricitons, Impaired perceived functional ability, Impaired flexibility, Impaired sensation, Improper body mechanics, Postural dysfunction, Pain  Visit Diagnosis: Pain in left hip  Pain in right hip  Stiffness of left hip, not elsewhere classified  Stiffness of right hip, not elsewhere classified     Problem List There are no active problems to display for this patient.   Collie Siad PT, DPT 10/04/2016, 11:38 AM  [facility] [address] [place], [address] Phone: [phone]   Fax:  [phone]  Name: Jared Freeman MRN: [id number] Date  of Birth: 1940/12/02

## 2016-10-05 ENCOUNTER — Ambulatory Visit
Admission: EM | Admit: 2016-10-05 | Discharge: 2016-10-05 | Disposition: A | Discharge status: Home / Self Care | Payer: Medicare Other | Attending: Family Medicine | Admitting: Family Medicine

## 2016-10-05 ENCOUNTER — Encounter: Payer: Self-pay | Admitting: *Deleted

## 2016-10-05 DIAGNOSIS — S40021A Contusion of right upper arm, initial encounter: Secondary | ICD-10-CM | POA: Diagnosis not present

## 2016-10-05 NOTE — ED Provider Notes (Signed)
[facility]    CSN: [id number] Arrival date & time: 10/05/16  1424     History   Chief Complaint Chief Complaint  Patient presents with  . Arm Problem    HPI Jared Freeman is a 76 y.o. male.   With hx of HTN, HLD, Arthritis, CAD, presents today for a nodule to his right upper forearm. 1 week ago he woke up and noticed his right arm was bruised without a known injury. Patient showed me a picture (see 1st pic below). The arm bruise eventually went away but 2 day ago he noticed a large nodule at the same area (see pic 2). Patient states that the size of the nodule fluctuate. He denies pain but is sore with deep palpation. No injury reported. He denies fever. He is unsure of insect bite. He stays fairly active. He is currently Aspirin 325 mg daily. Denies other anticoagulation use.        Past Medical History:  Diagnosis Date  . Anemia   . Arthritis   . Asthma   . Coronary artery disease   . Gout   . History of colon polyps   . Hyperlipidemia   . Hypertension   . Myocardial infarction ([place])   . Nasal septal defect   . Polyarthropathy   . S/P trigger finger release    bilateral  . Sleep apnea   . Urinary outflow obstruction     There are no active problems to display for this patient.   Past Surgical History:  Procedure Laterality Date  . APPENDECTOMY    . CARDIAC CATHETERIZATION     with stent  . CARPAL TUNNEL RELEASE Bilateral   . COLONOSCOPY    . COLONOSCOPY WITH PROPOFOL N/A 10/03/2014   Procedure: COLONOSCOPY WITH PROPOFOL;  Surgeon: Manya Silvas, MD;  Location: [facility] ENDOSCOPY;  Service: Endoscopy;  Laterality: N/A;  . ESOPHAGOGASTRODUODENOSCOPY    . GANGLION CYST EXCISION    . ROTATOR CUFF REPAIR    . TONSILLECTOMY         Home Medications    Prior to Admission medications   Medication Sig Start Date End Date Taking? Authorizing Provider  allopurinol (ZYLOPRIM) 300 MG tablet Take 300 mg by mouth daily.   Yes [provider]  amLODipine-benazepril (LOTREL) 5-20 MG per capsule Take 1 capsule by mouth daily.   Yes [provider]  aspirin 325 MG tablet Take 325 mg by mouth daily.   Yes [provider]  hydroxychloroquine (PLAQUENIL) 200 MG tablet Take by mouth daily.   Yes [provider]  rosuvastatin (CRESTOR) 5 MG tablet Take 5 mg by mouth daily.   Yes [provider]  cilostazol (PLETAL) 100 MG tablet Take 100 mg by mouth 2 (two) times daily.    [provider]    Family History History reviewed. No pertinent family history.  Social History Social History  Substance Use Topics  . Smoking status: Former Smoker    Types: Cigarettes  . Smokeless tobacco: Never Used  . Alcohol use No     Allergies   Patient has no known allergies.   Review of Systems Review of Systems  Constitutional:       See HPI     Physical Exam Triage Vital Signs ED Triage Vitals  Enc Vitals Group     BP 10/05/16 1434 120/81     Pulse Rate 10/05/16 1434 96     Resp 10/05/16 1434 16  Temp 10/05/16 1434 97.9 F (36.6 C)     Temp Source 10/05/16 1434 Oral     SpO2 10/05/16 1434 96 %     Weight 10/05/16 1434 200 lb (90.7 kg)     Height 10/05/16 1434 5\' 5"  (1.651 m)     Head Circumference --      Peak Flow --      Pain Score 10/05/16 1436 5     Pain Loc --      Pain Edu? --      Excl. in [place]? --    No data found.   Updated Vital Signs BP 120/81 (BP Location: Left Arm)   Pulse 96   Temp 97.9 F (36.6 C) (Oral)   Resp 16   Ht 5\' 5"  (1.651 m)   Wt 200 lb (90.7 kg)   SpO2 96%   BMI 33.28 kg/m   Visual Acuity Right Eye Distance:   Left Eye Distance:   Bilateral Distance:    Right Eye Near:   Left Eye Near:    Bilateral Near:     Physical Exam  Constitutional: He is oriented to person, place, and time. He appears well-developed and well-nourished.  Cardiovascular: Normal rate, regular rhythm and normal heart sounds.   Pulmonary/Chest: Effort normal  and breath sounds normal. He has no wheezes.  Neurological: He is alert and oriented to person, place, and time.  Skin:  Noted to have a 2 cm nodule to right upper forearm, no redness noted. No significant pain on palpation. See third picture.   Nursing note and vitals reviewed.          UC Treatments / Results  Labs (all labs ordered are listed, but only abnormal results are displayed) Labs Reviewed - No data to display  EKG  EKG Interpretation None       Radiology No results found.  Procedures Procedures (including critical care time)  Medications Ordered in UC Medications - No data to display   Initial Impression / Assessment and Plan / UC Course  I have reviewed the triage vital signs and the nursing notes.  Pertinent labs & imaging results that were available during my care of the patient were reviewed by me and considered in my medical decision making (see chart for details).    Final Clinical Impressions(s) / UC Diagnoses   Final diagnoses:  Contusion of right upper extremity, initial encounter   Most likely a subcutaneous hematoma given the history. Informed to continue to monitor the symptoms. Apply warm compress over the area; this will help it resolve faster. Return precaution discussed.    New Prescriptions Discharge Medication List as of 10/05/2016  3:03 PM     Controlled Substance Prescriptions [place] Controlled Substance Registry consulted? Not Applicable   Barry Dienes, NP 10/05/16 1515

## 2016-10-05 NOTE — ED Triage Notes (Signed)
Patient started having symptom of a bump or swelling on his right medial lower arm. Mechanism of injury unknown.

## 2016-10-11 ENCOUNTER — Encounter: Payer: Medicare Other | Admitting: Physical Therapy

## 2016-10-11 ENCOUNTER — Ambulatory Visit: Payer: Medicare Other

## 2016-10-18 ENCOUNTER — Ambulatory Visit: Payer: Medicare Other | Attending: Rheumatology | Admitting: Physical Therapy

## 2016-10-18 DIAGNOSIS — M25651 Stiffness of right hip, not elsewhere classified: Secondary | ICD-10-CM | POA: Insufficient documentation

## 2016-10-18 DIAGNOSIS — M25552 Pain in left hip: Secondary | ICD-10-CM

## 2016-10-18 DIAGNOSIS — M25551 Pain in right hip: Secondary | ICD-10-CM | POA: Diagnosis present

## 2016-10-18 DIAGNOSIS — M25652 Stiffness of left hip, not elsewhere classified: Secondary | ICD-10-CM | POA: Insufficient documentation

## 2016-10-18 NOTE — Patient Instructions (Signed)
Supine isometric clamshells   Sidelying clamshells with red t-band   Sidelying hip abduictions with yellow then just BW   Steps   Supine bridging x 10 ( well tolerated)

## 2016-10-20 NOTE — Therapy (Signed)
[facility] [address] [place], [address] Phone: [phone]   Fax:  [phone]  Physical Therapy Treatment  Patient Details  Name: Jared Freeman MRN: [id number] Date of Birth: Jan 10, 1941 Referring Provider: Meda Klinefelter, MD  Encounter Date: 10/18/2016      PT End of Session - 10/20/16 1452    Visit Number 3   Number of Visits 9   Date for PT Re-Evaluation December 16, 2016   Authorization Type G codes   Authorization Time Period 2/10   PT Start Time 0900   PT Stop Time 0945   PT Time Calculation (min) 45 min   Activity Tolerance Patient tolerated treatment well;No increased pain   Behavior During Therapy WFL for tasks assessed/performed      Past Medical History:  Diagnosis Date  . Anemia   . Arthritis   . Asthma   . Coronary artery disease   . Gout   . History of colon polyps   . Hyperlipidemia   . Hypertension   . Myocardial infarction ([place])   . Nasal septal defect   . Polyarthropathy   . S/P trigger finger release    bilateral  . Sleep apnea   . Urinary outflow obstruction     Past Surgical History:  Procedure Laterality Date  . APPENDECTOMY    . CARDIAC CATHETERIZATION     with stent  . CARPAL TUNNEL RELEASE Bilateral   . COLONOSCOPY    . COLONOSCOPY WITH PROPOFOL N/A 10/03/2014   Procedure: COLONOSCOPY WITH PROPOFOL;  Surgeon: Manya Silvas, MD;  Location: [facility] ENDOSCOPY;  Service: Endoscopy;  Laterality: N/A;  . ESOPHAGOGASTRODUODENOSCOPY    . GANGLION CYST EXCISION    . ROTATOR CUFF REPAIR    . TONSILLECTOMY      There were no vitals filed for this visit.      Subjective Assessment - 10/20/16 1450    Subjective Patient reports his primary aggravating factor is going up and down steps (even very limited). He does not have pain at night or in the mornings. 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 He has not been doing an HEP, but did find prolonged relief  from [facility].   Pertinent History Pt is a 76 y/o M who reports chronic Bil hip pain for ~6 months that occasionally radiates down to ankles Bil. Pt denies any back pain but reports his MD believes his hip pain is originating from his back.  Pt denies any MOI. 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  Pt has no pain when mowing but has pain later in the day if he has been driving or mowing for a long time.   Pt has held off on injections but did receive prednisone ~2 months ago with significant relief per chart review.  Aggravating factors: standing, walking, ascending/descending steps, bending. Relieving factors: sitting, prednisone.  Worst pain: 8/10, current pain 6/10, best pain 0/10.  Pt reports he has had some tingling in the past [redacted] weeks along lateral lower R LE that lasts for ~1-2 hrs and then completely resolves which is not consistent with a specific activity.  Denies any changes in bowel, bladder.  Denies any night sweats, no change in weight.  Denies saddle paresthesia.  Pt reports his pain resolves completely after sitting for  2-3 minutes.  Onset of pain begins immediately when he begins walking.  No pain with static standing.  Pt reports at times he is painfree for several days.  When he was 76 y/o he sprained R ankle, and he broke one of his ankles "a long time ago" but can't remember which one (did not have surgery for this).  No other previous injury either LE.  Pt reports instability in R ankle which causes him to fall at times as he finds himself falling to avoid twisting his ankle.  Pt sleeps with CPAP every night.  Sleeps on both sides, never sleeps on back or stomach.  Does not sleep with pillows between knees.  Pain is better in the morning and worsens as the day goes on, dependent on how active he is during the day. He has had several falls for "no reason".  Pt  has had pain in posterior calves for the past 10 years which limits his ambulatory distance due to pain. He must rest before continuing on.  Pt reports h/o surgery in L hand with PT for several months after pulling a tendon. Pt has not received PT for his current problem.     Limitations Walking;Other (comment)  ascneding/descending steps   How long can you sit comfortably? no limitation   How long can you stand comfortably? no limitation   How long can you walk comfortably? 5 seconds   Diagnostic tests MRI on 03/20/16 which revealed Mild central canal and subarticular recess narrowing at L4-5. There is also bilateral foraminal narrowing at this level, worse on the right, where it is moderate to moderately severe. Mild to moderate bilateral foraminal narrowing L5-S1 is worse on the right. Facet degenerative disease appearing worst at L4-5 and L5-S1.    Patient Stated Goals To decrease fear of falling.  To have no pain ascending/descending steps.     Currently in Pain? No/denies      Supine isometric clamshells 3 sets x 10 submaximal contractions x3-5" holds   Sidelying clamshells with red t-band x 12 for 2 sets bilaterally.   Sidelying hip abduictions with yellow then just BW as yellow t-band promoted excessive recruitment of TFL. X 10 bilaterally for 2 sets   Steps -- patient reported no increase in pain/discomfort.   Supine bridging x 10 ( well tolerated) appropriate ROM noted.                            PT Education - 10/20/16 1450    Education provided Yes   Education Details To complete isometric clamshells regularly for pain control.    Person(s) Educated Patient   Methods Explanation;Demonstration;Handout   Comprehension Verbalized understanding;Returned demonstration             PT Long Term Goals - 09/26/16 1119      PT LONG TERM GOAL #1   Title Pt will report worst pain 4/10 with ascending/descending steps, bending, etc. for improved QOL.    Baseline 09/26/16: Worst pain 8/10   Time 4   Period Weeks   Status New     PT LONG TERM GOAL #2   Title Pt's Bil hip IR will improve to WNL for decreased stiffness and pain   Baseline 09/26/16: L 29, R 30   Time 6   Period Weeks   Status New     PT LONG TERM GOAL #3   Title Pt will be able to complete work activities (  driving, mowing) without pain following for improved QOL   Baseline Pain following increased activity during the day   Time 8   Period Weeks   Status New     PT LONG TERM GOAL #4   Title Pt will improve FABQ (physical activity) by at least 7 points to demonstrate improved pain with daily physical activities   Baseline 09/26/16: 20/24   Time 6   Period Weeks   Status New               Plan - 10/20/16 1452    Clinical Impression Statement Patient has had positive response to therapy thus far and is able to ascend and descend steps in clinic this date without any increase in pain. Patient provided with progression of gluteal strengthening to increase LE strength, well tolerated. He was encouraged to complete isometric clamshells more frequently for long term pain relief.    Clinical Presentation Stable   Clinical Decision Making Low   Rehab Potential Good   Clinical Impairments Affecting Rehab Potential (+) success with PT in the past, pt motivated to decrease pain for ability to complete work duties painfree (-) Chronicity of pain, physical demands of job duties   PT Frequency 1x / week   PT Duration 8 weeks   PT Treatment/Interventions ADLs/Self Care Home Management;Aquatic Therapy;Cryotherapy;Electrical Stimulation;Iontophoresis 4mg /ml Dexamethasone;Moist Heat;Traction;Ultrasound;DME Instruction;Gait training;Stair training;Functional mobility training;Therapeutic activities;Therapeutic exercise;Balance training;Neuromuscular re-education;Patient/family education;Manual techniques;Passive range of motion;Dry needling;Energy conservation;Taping   PT Next Visit  Plan Berg, STM Bil glutes, CPAs lumbar spine, assess Bil hip joint mobility and address as appropriate   PT Home Exercise Plan pt to implement new technique for descending steps   Consulted and Agree with Plan of Care Patient      Patient will benefit from skilled therapeutic intervention in order to improve the following deficits and impairments:  Abnormal gait, Decreased balance, Decreased mobility, Decreased range of motion, Difficulty walking, Hypomobility, Increased fascial restricitons, Impaired perceived functional ability, Impaired flexibility, Impaired sensation, Improper body mechanics, Postural dysfunction, Pain  Visit Diagnosis: Pain in left hip  Pain in right hip  Stiffness of left hip, not elsewhere classified  Stiffness of right hip, not elsewhere classified     Problem List There are no active problems to display for this patient.  Royce Macadamia PT, DPT, CSCS    10/20/2016, 2:54 PM  [facility] [address] [place], [address] Phone: [phone]   Fax:  [phone]  Name: ROOSVELT CHURCHWELL MRN: [id number] Date of Birth: 28-Sep-1940

## 2016-10-22 ENCOUNTER — Other Ambulatory Visit: Payer: Self-pay | Admitting: Internal Medicine

## 2016-10-22 DIAGNOSIS — T148XXA Other injury of unspecified body region, initial encounter: Secondary | ICD-10-CM

## 2016-10-23 ENCOUNTER — Ambulatory Visit
Admission: RE | Admit: 2016-10-23 | Discharge: 2016-10-23 | Disposition: A | Discharge status: Home / Self Care | Payer: Medicare Other | Source: Ambulatory Visit | Attending: Internal Medicine | Admitting: Internal Medicine

## 2016-10-23 DIAGNOSIS — X58XXXA Exposure to other specified factors, initial encounter: Secondary | ICD-10-CM | POA: Diagnosis not present

## 2016-10-23 DIAGNOSIS — S40021A Contusion of right upper arm, initial encounter: Secondary | ICD-10-CM | POA: Insufficient documentation

## 2016-10-23 DIAGNOSIS — T148XXA Other injury of unspecified body region, initial encounter: Secondary | ICD-10-CM

## 2016-11-01 ENCOUNTER — Ambulatory Visit: Payer: Medicare Other | Admitting: Physical Therapy

## 2016-11-01 DIAGNOSIS — M25552 Pain in left hip: Secondary | ICD-10-CM

## 2016-11-01 DIAGNOSIS — M25551 Pain in right hip: Secondary | ICD-10-CM

## 2016-11-01 DIAGNOSIS — M25651 Stiffness of right hip, not elsewhere classified: Secondary | ICD-10-CM

## 2016-11-01 DIAGNOSIS — M25652 Stiffness of left hip, not elsewhere classified: Secondary | ICD-10-CM

## 2016-11-01 NOTE — Therapy (Signed)
[facility] [address] [place], [address] Phone: [phone]   Fax:  [phone]  Physical Therapy Treatment  Patient Details  Name: Jared Freeman MRN: [id number] Date of Birth: [birth year] Referring Provider: Meda Klinefelter, MD  Encounter Date: 11/01/2016      PT End of Session - 11/01/16 1401    Visit Number 4   Number of Visits 9   Date for PT Re-Evaluation 11/22/16   Authorization Type G codes   Authorization Time Period 4/10   PT Start Time 1309   PT Stop Time 1332   PT Time Calculation (min) 23 min   Activity Tolerance Patient tolerated treatment well;No increased pain   Behavior During Therapy WFL for tasks assessed/performed      Past Medical History:  Diagnosis Date  . Anemia   . Arthritis   . Asthma   . Coronary artery disease   . Gout   . History of colon polyps   . Hyperlipidemia   . Hypertension   . Myocardial infarction ([place])   . Nasal septal defect   . Polyarthropathy   . S/P trigger finger release    bilateral  . Sleep apnea   . Urinary outflow obstruction     Past Surgical History:  Procedure Laterality Date  . APPENDECTOMY    . CARDIAC CATHETERIZATION     with stent  . CARPAL TUNNEL RELEASE Bilateral   . COLONOSCOPY    . COLONOSCOPY WITH PROPOFOL N/A 10/03/2014   Procedure: COLONOSCOPY WITH PROPOFOL;  Surgeon: Manya Silvas, MD;  Location: [facility] ENDOSCOPY;  Service: Endoscopy;  Laterality: N/A;  . ESOPHAGOGASTRODUODENOSCOPY    . GANGLION CYST EXCISION    . ROTATOR CUFF REPAIR    . TONSILLECTOMY      There were no vitals filed for this visit.      Subjective Assessment - 11/01/16 1322    Subjective Patient reports he is certainly feeling much better, he is not quite 100% but much better with the isometric clamshells.    Pertinent History Pt is a 76 y/o M who reports chronic Bil hip pain for ~6 months that occasionally radiates down to ankles Bil.  Pt denies any back pain but reports his MD believes his hip pain is originating from his back.  Pt denies any MOI. 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  Pt has no pain when mowing but has pain later in the day if he has been driving or mowing for a long time.   Pt has held off on injections but did receive prednisone ~2 months ago with significant relief per chart review.  Aggravating factors: standing, walking, ascending/descending steps, bending. Relieving factors: sitting, prednisone.  Worst pain: 8/10, current pain 6/10, best pain 0/10.  Pt reports he has had some tingling in the past [redacted] weeks along lateral lower R LE that lasts for ~1-2 hrs and then completely resolves which is not consistent with a specific activity.  Denies any changes in bowel, bladder.  Denies any night sweats, no change in weight.  Denies saddle paresthesia.  Pt reports his pain resolves completely after sitting for 2-3 minutes.  Onset of pain begins immediately when he begins walking.  No pain with static standing.  Pt reports at times he is painfree for  several days.  When he was 76 y/o he sprained R ankle, and he broke one of his ankles "a long time ago" but can't remember which one (did not have surgery for this).  No other previous injury either LE.  Pt reports instability in R ankle which causes him to fall at times as he finds himself falling to avoid twisting his ankle.  Pt sleeps with CPAP every night.  Sleeps on both sides, never sleeps on back or stomach.  Does not sleep with pillows between knees.  Pain is better in the morning and worsens as the day goes on, dependent on how active he is during the day. He has had several falls for "no reason".  Pt has had pain in posterior calves for the past 10 years which limits his ambulatory distance due to pain. He must rest before continuing on.   Pt reports h/o surgery in L hand with PT for several months after pulling a tendon. Pt has not received PT for his current problem.     Limitations Walking;Other (comment)  ascneding/descending steps   How long can you sit comfortably? no limitation   How long can you stand comfortably? no limitation   How long can you walk comfortably? 5 seconds   Diagnostic tests MRI on 03/20/16 which revealed Mild central canal and subarticular recess narrowing at L4-5. There is also bilateral foraminal narrowing at this level, worse on the right, where it is moderate to moderately severe. Mild to moderate bilateral foraminal narrowing L5-S1 is worse on the right. Facet degenerative disease appearing worst at L4-5 and L5-S1.    Patient Stated Goals To decrease fear of falling.  To have no pain ascending/descending steps.     Currently in Pain? No/denies      Standing hip abductions with yellow t-band x 12 per side on blue foam pad   Single leg bridging x 10 repetitions bilaterally   Ascending and descending > 20 steps without use of handrails with no increase in pain   Side stepping with yellow t-band x 8 per side for 2 sets, with no increase in pain   Single leg deadlifts with 1 HHA (1 finger) x 10 per side (no increased pain)                             PT Education - 11/01/16 1401    Education provided Yes   Education Details Progressed HEP will likely d/c at next visit.    Person(s) Educated Patient   Methods Explanation;Demonstration;Handout   Comprehension Verbalized understanding;Returned demonstration             PT Long Term Goals - 09/26/16 1119      PT LONG TERM GOAL #1   Title Pt will report worst pain 4/10 with ascending/descending steps, bending, etc. for improved QOL.   Baseline 09/26/16: Worst pain 8/10   Time 4   Period Weeks   Status New     PT LONG TERM GOAL #2   Title Pt's Bil hip IR will improve to WNL for decreased stiffness and pain    Baseline 09/26/16: L 29, R 30   Time 6   Period Weeks   Status New     PT LONG TERM GOAL #3   Title Pt will be able to complete work activities (driving, mowing) without pain following for improved QOL   Baseline Pain following increased activity during the day  Time 8   Period Weeks   Status New     PT LONG TERM GOAL #4   Title Pt will improve FABQ (physical activity) by at least 7 points to demonstrate improved pain with daily physical activities   Baseline 09/26/16: 20/24   Time 6   Period Weeks   Status New               Plan - 11/19/2016 1402    Clinical Impression Statement Patient has had near resolution of symptoms with clamshells in supine isometrically, progressed gluteal strengthening with good tolerance, no pain ascending/descending steps. He has progressed quite nicely, will likely d/c with all goals met at follow up.    Clinical Presentation Stable   Clinical Decision Making Low   Rehab Potential Good   Clinical Impairments Affecting Rehab Potential (+) success with PT in the past, pt motivated to decrease pain for ability to complete work duties painfree (-) Chronicity of pain, physical demands of job duties   PT Frequency 1x / week   PT Duration 8 weeks   PT Treatment/Interventions ADLs/Self Care Home Management;Aquatic Therapy;Cryotherapy;Electrical Stimulation;Iontophoresis 30m/ml Dexamethasone;Moist Heat;Traction;Ultrasound;DME Instruction;Gait training;Stair training;Functional mobility training;Therapeutic activities;Therapeutic exercise;Balance training;Neuromuscular re-education;Patient/family education;Manual techniques;Passive range of motion;Dry needling;Energy conservation;Taping   PT Next Visit Plan Berg, STM Bil glutes, CPAs lumbar spine, assess Bil hip joint mobility and address as appropriate   PT Home Exercise Plan pt to implement new technique for descending steps   Consulted and Agree with Plan of Care Patient      Patient will benefit from  skilled therapeutic intervention in order to improve the following deficits and impairments:  Abnormal gait, Decreased balance, Decreased mobility, Decreased range of motion, Difficulty walking, Hypomobility, Increased fascial restricitons, Impaired perceived functional ability, Impaired flexibility, Impaired sensation, Improper body mechanics, Postural dysfunction, Pain  Visit Diagnosis: Pain in left hip  Pain in right hip  Stiffness of left hip, not elsewhere classified  Stiffness of right hip, not elsewhere classified       G-Codes - 0October 09, 20181403    Functional Assessment Tool Used (Outpatient Only) mODI, FABQ, clinical judgement, ROM, strength, functional mobility   Functional Limitation Mobility: Walking and moving around   Mobility: Walking and Moving Around Current Status ((R1735 At least 1 percent but less than 20 percent impaired, limited or restricted   Mobility: Walking and Moving Around Goal Status ((A7014 0 percent impaired, limited or restricted      Problem List There are no active problems to display for this patient.  PRoyce MacadamiaPT, DPT, CSCS    92018-10-09 2:04 PM  [facility] [address] [place] [address]Phone: [phone]  Fax:  [phone] Name: Jared BOUWENSMRN: [id number] of Birth: [birth year]

## 2016-11-01 NOTE — Patient Instructions (Signed)
Standing hip abductions with yellow t-band x 12 per side on blue foam pad   Single leg bridging x 10 repetitions

## 2016-11-14 ENCOUNTER — Other Ambulatory Visit: Payer: Self-pay

## 2016-11-14 DIAGNOSIS — R972 Elevated prostate specific antigen [PSA]: Secondary | ICD-10-CM

## 2016-11-15 ENCOUNTER — Encounter: Payer: Self-pay | Admitting: Urology

## 2016-11-15 ENCOUNTER — Other Ambulatory Visit
Admission: RE | Admit: 2016-11-15 | Discharge: 2016-11-15 | Disposition: A | Discharge status: Home / Self Care | Payer: Medicare Other | Source: Ambulatory Visit | Attending: Urology | Admitting: Urology

## 2016-11-15 ENCOUNTER — Ambulatory Visit (INDEPENDENT_AMBULATORY_CARE_PROVIDER_SITE_OTHER): Payer: Medicare Other | Admitting: Urology

## 2016-11-15 VITALS — BP 126/75 | HR 77 | Ht 65.0 in | Wt 210.0 lb

## 2016-11-15 DIAGNOSIS — N401 Enlarged prostate with lower urinary tract symptoms: Secondary | ICD-10-CM | POA: Diagnosis not present

## 2016-11-15 DIAGNOSIS — R3915 Urgency of urination: Secondary | ICD-10-CM | POA: Diagnosis not present

## 2016-11-15 DIAGNOSIS — R972 Elevated prostate specific antigen [PSA]: Secondary | ICD-10-CM

## 2016-11-15 DIAGNOSIS — Z8549 Personal history of malignant neoplasm of other male genital organs: Secondary | ICD-10-CM | POA: Diagnosis not present

## 2016-11-15 LAB — PSA: Prostatic Specific Antigen: 5.03 ng/mL — ABNORMAL HIGH (ref 0.00–4.00)

## 2016-11-15 NOTE — Progress Notes (Signed)
11/15/2016 4:27 PM   Jared Freeman 28-Aug-1940 [id number]  Referring provider: Idelle Crouch, MD [facility] [place], [place] [address]  Chief Complaint  Patient presents with  . Elevated PSA    New Patient    HPI: 76 year old male referred for further evaluation of elevated PSA.  He underwent routine anal PSA screening by his PCP. His most recent PSA is up to 9.69 up from 4.88 the previous year.    He denies a family history of prostate cancer.  No weight loss or bone pain.  New acute back pain with leg leg pain, currently on prednisone which is new for him.    He does have some baseline urinary symptoms including urinary leakage with urgency when he delays voiding.  He does have good stream and is able to empty well except for dysuria.    He does take Flomax.  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 Evorn Gong.    PMH: Past Medical History:  Diagnosis Date  . Anemia   . Arthritis   . Asthma   . Coronary artery disease   . Gout   . History of colon polyps   . Hyperlipidemia   . Hypertension   . Myocardial infarction ([place])   . Nasal septal defect   . Penile cancer ([place]) 2014  . Polyarthropathy   . S/P trigger finger release    bilateral  . Sleep apnea   . Urinary outflow obstruction     Surgical History: Past Surgical History:  Procedure Laterality Date  . APPENDECTOMY    . CARDIAC CATHETERIZATION     with stent  . CARPAL TUNNEL RELEASE Bilateral   . COLONOSCOPY    . COLONOSCOPY WITH PROPOFOL N/A 10/03/2014   Procedure: COLONOSCOPY WITH PROPOFOL;  Surgeon: Manya Silvas, MD;  Location: [facility] ENDOSCOPY;  Service: Endoscopy;  Laterality: N/A;  . ESOPHAGOGASTRODUODENOSCOPY    . GANGLION CYST EXCISION    . PENILE BIOPSY  2014   Squamous Cell Carcinoma  . ROTATOR CUFF REPAIR      . TONSILLECTOMY      Home Medications:  Allergies as of 11/15/2016   No Known Allergies     Medication List       Accurate as of 11/15/16 11:59 PM. Always use your most recent med list.          allopurinol 300 MG tablet Commonly known as:  ZYLOPRIM Take 300 mg by mouth daily.   amLODipine-benazepril 5-20 MG capsule Commonly known as:  LOTREL Take 1 capsule by mouth daily.   aspirin 325 MG tablet Take 325 mg by mouth daily.   cilostazol 100 MG tablet Commonly known as:  PLETAL Take 100 mg by mouth 2 (two) times daily.   escitalopram 10 MG tablet Commonly known as:  LEXAPRO Take by mouth.   hydroxychloroquine 200 MG tablet Commonly known as:  PLAQUENIL Take by mouth daily.   predniSONE 5 MG tablet Commonly known as:  DELTASONE Take as directed - 6 day taper   rosuvastatin 5 MG tablet Commonly known as:  CRESTOR Take 5 mg by mouth daily.   tamsulosin 0.4 MG Caps capsule Commonly known as:  FLOMAX Take by mouth.       Allergies: No Known Allergies  Family History: No family history on file.  Social History:  reports that he has  quit smoking. His smoking use included Cigarettes. He has never used smokeless tobacco. He reports that he does not drink alcohol or use drugs.  ROS: UROLOGY Frequent Urination?: No Hard to postpone urination?: Yes Burning/pain with urination?: No Get up at night to urinate?: No Leakage of urine?: Yes Urine stream starts and stops?: No Trouble starting stream?: No Do you have to strain to urinate?: No Blood in urine?: No Urinary tract infection?: No Sexually transmitted disease?: No Injury to kidneys or bladder?: No Painful intercourse?: No Weak stream?: No Erection problems?: Yes Penile pain?: No  Gastrointestinal Nausea?: No Vomiting?: No Indigestion/heartburn?: No Diarrhea?: No Constipation?: No  Constitutional Fever: No Night sweats?: No Weight loss?: No Fatigue?: No  Skin Skin rash/lesions?:  No Itching?: No  Eyes Blurred vision?: No Double vision?: No  Ears/Nose/Throat Sore throat?: No Sinus problems?: Yes  Hematologic/Lymphatic Swollen glands?: No Easy bruising?: Yes  Cardiovascular Leg swelling?: No Chest pain?: No  Respiratory Cough?: No Shortness of breath?: Yes  Endocrine Excessive thirst?: No  Musculoskeletal Back pain?: Yes Joint pain?: No  Neurological Headaches?: No Dizziness?: No  Psychologic Depression?: No Anxiety?: No  Physical Exam: BP 126/75   Pulse 77   Ht 5\' 5"  (1.651 m)   Wt 210 lb (95.3 kg)   BMI 34.95 kg/m   Constitutional:  Alert and oriented, No acute distress. HEENT: [place] AT, moist mucus membranes.  Trachea midline, no masses. Cardiovascular: No clubbing, cyanosis, or edema. Respiratory: Normal respiratory effort, no increased work of breathing. GI: Abdomen is soft, nontender, nondistended, no abdominal masses. Obese. GU: No CVA tenderness. Uncirc'ed phallus with subtile right coronal margin scar, no penile lesions.  Bilateral testicles descended.  Rectal: + hemorrhoids.  40 cc prostate, rubbery nodule at left apex although not firm.  Nontender.   Skin: No rashes, bruises or suspicious lesions. Lymph: No cervical or inguinal adenopathy. Neurologic: Grossly intact, no focal deficits, moving all 4 extremities. Psychiatric: Normal mood and affect.  Laboratory Data: Lab Results  Component Value Date   WBC 12.1 (H) 05/15/2011   HGB 13.6 05/15/2011   HCT 41.4 05/15/2011   MCV 98 05/15/2011   PLT 191 05/15/2011    Lab Results  Component Value Date   CREATININE 1.56 (H) 05/14/2011    Urinalysis Urinalysis w/Microscopic Urinalysis w/Microscopic  Component Value Ref Range Performed At  Color Yellow Yellow, Straw, Blue [facility]  Clarity Clear Clear, Slightly Cloudy, Turbid Other [facility]  Specific Gravity 1.010 1.000 - 1.030 [facility]  pH, Urine 5.5 5.0 - 8.0  [facility]  Protein, Urinalysis Negative Negative, Trace mg/dL [facility]  Glucose, Urinalysis Negative Negative mg/dL [facility]  Ketones, Urinalysis Negative Negative mg/dL [facility]  Blood, Urinalysis Negative Negative [facility]  Nitrite, Urinalysis Negative Negative [facility]  Leukocyte Esterase, Urinalysis Small (A) Negative [facility]  White Blood Cells, Urinalysis 0-3 None Seen, 0-3 /hpf [facility]  Red Blood Cells, Urinalysis None Seen None Seen, 0-3 /hpf [place] [facility]  Bacteria, Urinalysis None Seen None Seen /hpf [place] [facility]  Squamous Epithelial Cells, Urinalysis Rare Rare, Few, None Seen /hpf [place] [facility]    Pertinent Imaging: N/a  Assessment & Plan:    1. Elevated PSA  We reviewed the implications of an elevated  PSA and the uncertainty surrounding it. In general, a man's PSA increases with age and is produced by both normal and cancerous prostate tissue. Differential for elevated PSA is BPH, prostate cancer, infection, recent intercourse/ejaculation, prostate infarction, recent urethroscopic manipulation (foley placement/cystoscopy) and prostatitis. Management of an elevated PSA can include observation or prostate biopsy and wediscussed this in detail.  We discussed that indications for prostate biopsy are defined by age and race specific PSA cutoffs as well as a PSA velocity of 0.75/year.  Plan for repeat PSA. Based on the results, will recommend either biopsy or continued observation.  2. Benign prostatic hyperplasia (BPH) with urinary urgency Continue Flomax Occasional urgency with delays voiding, overall bothers minimal  3. History of penile cancer No evidence of recurrence on exam today Followed by Dr. Evorn Gong  Will call with PSA and follow-up plan  Hollice Espy, MD  [facility] [address] [place], [place] [address] [phone]

## 2016-11-19 ENCOUNTER — Telehealth: Payer: Self-pay

## 2016-11-19 NOTE — Telephone Encounter (Signed)
-----   Message from Hollice Espy, MD sent at 11/18/2016  4:28 PM EDT ----- Please let the patient know that his PSA is down to 5.03. This is down significantly from when it was checked by Dr. Doy Hutching. This is very reassuring. It still elevated but closer to his baseline. I would like to see him back in 6 months with PSA just prior to his visit.  Hollice Espy, MD

## 2016-11-19 NOTE — Telephone Encounter (Signed)
LMOM

## 2016-11-20 NOTE — Telephone Encounter (Signed)
Spoke with pt in reference to PSA results and needing a 63mo f/u. Pt voiced understanding.

## 2017-03-06 DIAGNOSIS — M0609 Rheumatoid arthritis without rheumatoid factor, multiple sites: Secondary | ICD-10-CM | POA: Insufficient documentation

## 2017-04-06 ENCOUNTER — Encounter: Payer: Self-pay | Admitting: Gynecology

## 2017-04-06 ENCOUNTER — Ambulatory Visit
Admission: EM | Admit: 2017-04-06 | Discharge: 2017-04-06 | Disposition: A | Discharge status: Home / Self Care | Payer: Medicare Other | Attending: Family Medicine | Admitting: Family Medicine

## 2017-04-06 ENCOUNTER — Other Ambulatory Visit: Payer: Self-pay

## 2017-04-06 DIAGNOSIS — J441 Chronic obstructive pulmonary disease with (acute) exacerbation: Secondary | ICD-10-CM

## 2017-04-06 DIAGNOSIS — R05 Cough: Secondary | ICD-10-CM | POA: Diagnosis not present

## 2017-04-06 DIAGNOSIS — R0602 Shortness of breath: Secondary | ICD-10-CM

## 2017-04-06 MED ORDER — PREDNISONE 50 MG PO TABS: ORAL_TABLET | ORAL | 0 refills | Status: DC

## 2017-04-06 MED ORDER — DOXYCYCLINE HYCLATE 100 MG PO CAPS: 100.0000 mg | ORAL_CAPSULE | Freq: Two times a day (BID) | ORAL | 0 refills | Status: DC

## 2017-04-06 NOTE — Discharge Instructions (Signed)
Medications as prescribed. ° °Take care ° °Dr. Eleanor Gatliff  °

## 2017-04-06 NOTE — ED Triage Notes (Signed)
Per patient c/o cough / chest congestion and greenish mucous x 3 days.

## 2017-04-06 NOTE — ED Provider Notes (Signed)
[facility]    CSN: [id number] Arrival date & time: 04/06/17  1203   History   Chief Complaint Chief Complaint  Patient presents with  . Cough   HPI  77 year old male with a history of COPD per his chart presents with cough and congestion.  Patient reports a 3-day history of productive cough.  Green sputum.  No associated fever.  He does endorse increasing shortness of breath.  He states that he has a history of pneumonia.  No medications or interventions tried.  No known exacerbating relieving factors.  No other reported symptoms.  No other complaints at this time.  Past Medical History:  Diagnosis Date  . Anemia   . Arthritis   . Asthma   . Coronary artery disease   . Gout   . History of colon polyps   . Hyperlipidemia   . Hypertension   . Myocardial infarction ([place])   . Nasal septal defect   . Penile cancer ([place]) 2014  . Polyarthropathy   . S/P trigger finger release    bilateral  . Sleep apnea   . Urinary outflow obstruction     There are no active problems to display for this patient.   Past Surgical History:  Procedure Laterality Date  . APPENDECTOMY    . CARDIAC CATHETERIZATION     with stent  . CARPAL TUNNEL RELEASE Bilateral   . COLONOSCOPY    . COLONOSCOPY WITH PROPOFOL N/A 10/03/2014   Procedure: COLONOSCOPY WITH PROPOFOL;  Surgeon: Manya Silvas, MD;  Location: [facility] ENDOSCOPY;  Service: Endoscopy;  Laterality: N/A;  . ESOPHAGOGASTRODUODENOSCOPY    . GANGLION CYST EXCISION    . PENILE BIOPSY  2014   Squamous Cell Carcinoma  . ROTATOR CUFF REPAIR    . TONSILLECTOMY      Home Medications    Prior to Admission medications   Medication Sig Start Date End Date Taking? Authorizing Provider  allopurinol (ZYLOPRIM) 300 MG tablet Take 300 mg by mouth daily.   Yes [provider]  amLODipine-benazepril (LOTREL) 5-20 MG per capsule Take 1 capsule by mouth daily.   Yes [provider]  aspirin 325 MG tablet Take 325 mg by  mouth daily.   Yes [provider]  cilostazol (PLETAL) 100 MG tablet Take 100 mg by mouth 2 (two) times daily.   Yes [provider]  hydroxychloroquine (PLAQUENIL) 200 MG tablet Take by mouth daily.   Yes [provider]  rosuvastatin (CRESTOR) 5 MG tablet Take 5 mg by mouth daily.   Yes [provider]  tamsulosin (FLOMAX) 0.4 MG CAPS capsule Take by mouth. 07/30/16 07/30/17 Yes [provider]  doxycycline (VIBRAMYCIN) 100 MG capsule Take 1 capsule (100 mg total) by mouth 2 (two) times daily. 04/06/17   Coral Spikes, DO  escitalopram (LEXAPRO) 10 MG tablet Take by mouth. 09/13/16 12/12/16  [provider]  predniSONE (DELTASONE) 50 MG tablet 1 tablet daily x 5 days. 04/06/17   Coral Spikes, DO    Family History No family history on file.  Social History Social History   Tobacco Use  . Smoking status: Former Smoker    Types: Cigarettes  . Smokeless tobacco: Never Used  Substance Use Topics  . Alcohol use: No  . Drug use: No     Allergies   Patient has no known allergies.   Review of Systems Review of Systems  Constitutional: Negative for chills and fever.  Respiratory: Positive for cough and shortness  of breath.    Physical Exam Triage Vital Signs ED Triage Vitals  Enc Vitals Group     BP 04/06/17 1216 114/62     Pulse Rate 04/06/17 1216 68     Resp 04/06/17 1216 16     Temp 04/06/17 1216 98 F (36.7 C)     Temp Source 04/06/17 1216 Oral     SpO2 04/06/17 1216 97 %     Weight 04/06/17 1217 190 lb (86.2 kg)     Height 04/06/17 1217 5\' 5"  (1.651 m)     Head Circumference --      Peak Flow --      Pain Score 04/06/17 1217 5     Pain Loc --      Pain Edu? --      Excl. in [place]? --    Updated Vital Signs BP 114/62 (BP Location: Left Arm)   Pulse 68   Temp 98 F (36.7 C) (Oral)   Resp 16   Ht 5\' 5"  (1.651 m)   Wt 190 lb (86.2 kg)   SpO2 97%   BMI 31.62 kg/m   Physical Exam  Constitutional: He is oriented  to person, place, and time. He appears well-developed. No distress.  HENT:  Head: Normocephalic and atraumatic.  Mouth/Throat: Oropharynx is clear and moist.  Cardiovascular: Normal rate and regular rhythm.  Pulmonary/Chest: Effort normal and breath sounds normal. He has no wheezes. He has no rales.  Neurological: He is alert and oriented to person, place, and time.  Psychiatric: He has a normal mood and affect. His behavior is normal.  Nursing note and vitals reviewed.  UC Treatments / Results  Labs (all labs ordered are listed, but only abnormal results are displayed) Labs Reviewed - No data to display  EKG  EKG Interpretation None       Radiology No results found.  Procedures Procedures (including critical care time)  Medications Ordered in UC Medications - No data to display   Initial Impression / Assessment and Plan / UC Course  I have reviewed the triage vital signs and the nursing notes.  Pertinent labs & imaging results that were available during my care of the patient were reviewed by me and considered in my medical decision making (see chart for details).     77 year old male presents with a COPD exacerbation.  Treating as below.  Final Clinical Impressions(s) / UC Diagnoses   Final diagnoses:  COPD exacerbation [facility])    ED Discharge Orders        Ordered    predniSONE (DELTASONE) 50 MG tablet     04/06/17 1242    doxycycline (VIBRAMYCIN) 100 MG capsule  2 times daily     04/06/17 1242     Controlled Substance Prescriptions [place] Controlled Substance Registry consulted? Not Applicable   Coral Spikes, DO 04/06/17 1300

## 2017-04-09 ENCOUNTER — Telehealth: Payer: Self-pay | Admitting: Emergency Medicine

## 2017-04-09 NOTE — Telephone Encounter (Signed)
Called to follow up after patient's recent visit. Patient states he is feeling much better.

## 2017-04-30 ENCOUNTER — Other Ambulatory Visit: Payer: Self-pay | Admitting: Internal Medicine

## 2017-04-30 DIAGNOSIS — N183 Chronic kidney disease, stage 3 unspecified: Secondary | ICD-10-CM

## 2017-05-08 ENCOUNTER — Ambulatory Visit
Admission: RE | Admit: 2017-05-08 | Discharge: 2017-05-08 | Disposition: A | Discharge status: Home / Self Care | Payer: Medicare Other | Source: Ambulatory Visit | Attending: Internal Medicine | Admitting: Internal Medicine

## 2017-05-08 DIAGNOSIS — N400 Enlarged prostate without lower urinary tract symptoms: Secondary | ICD-10-CM | POA: Insufficient documentation

## 2017-05-08 DIAGNOSIS — N183 Chronic kidney disease, stage 3 unspecified: Secondary | ICD-10-CM

## 2017-05-15 ENCOUNTER — Other Ambulatory Visit: Payer: Medicare Other

## 2017-05-16 ENCOUNTER — Encounter: Payer: Self-pay | Admitting: Urology

## 2017-05-16 ENCOUNTER — Other Ambulatory Visit
Admission: RE | Admit: 2017-05-16 | Discharge: 2017-05-16 | Disposition: A | Discharge status: Home / Self Care | Payer: Medicare Other | Source: Ambulatory Visit | Attending: Urology | Admitting: Urology

## 2017-05-16 ENCOUNTER — Ambulatory Visit (INDEPENDENT_AMBULATORY_CARE_PROVIDER_SITE_OTHER): Payer: Medicare Other | Admitting: Urology

## 2017-05-16 VITALS — BP 119/71 | HR 72

## 2017-05-16 DIAGNOSIS — N5203 Combined arterial insufficiency and corporo-venous occlusive erectile dysfunction: Secondary | ICD-10-CM | POA: Diagnosis not present

## 2017-05-16 DIAGNOSIS — Z8549 Personal history of malignant neoplasm of other male genital organs: Secondary | ICD-10-CM | POA: Diagnosis not present

## 2017-05-16 DIAGNOSIS — R3915 Urgency of urination: Secondary | ICD-10-CM

## 2017-05-16 DIAGNOSIS — N401 Enlarged prostate with lower urinary tract symptoms: Secondary | ICD-10-CM | POA: Diagnosis not present

## 2017-05-16 DIAGNOSIS — R972 Elevated prostate specific antigen [PSA]: Secondary | ICD-10-CM | POA: Diagnosis present

## 2017-05-16 LAB — PSA: PROSTATIC SPECIFIC ANTIGEN: 7.02 ng/mL — AB (ref 0.00–4.00)

## 2017-05-16 MED ORDER — SILDENAFIL CITRATE 20 MG PO TABS: 20.0000 mg | ORAL_TABLET | ORAL | 11 refills | Status: DC | PRN

## 2017-05-16 NOTE — Progress Notes (Signed)
05/16/2017 2:28 PM   Jared Freeman Dec 17, 1940 [id number]  Referring provider: Idelle Crouch, MD [facility] [place], [place] [address]  Chief Complaint  Patient presents with  . Elevated PSA    27month    HPI: 77 year old male with history of elevated PSA who returns today for routine six-month follow-up.  He denies a family history of prostate cancer.  No weight loss or bone pain.    He does have some baseline urinary symptoms including urinary leakage with urgency when he delays voiding.  He does have good stream and is able to empty well. He does take Flomax.    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 Evorn Gong.    He mentions today that he has been struggling with ED x 4 years after treatment for penile cancer.  He reports that he does wife have not been particularly sexually active as when he was diagnosed with penile cancer, his physician inferred that the cause of the cancer was due to his wife and she has been afraid to have intercourse since.  PMH: Past Medical History:  Diagnosis Date  . Anemia   . Arthritis   . Asthma   . Coronary artery disease   . Gout   . History of colon polyps   . Hyperlipidemia   . Hypertension   . Myocardial infarction ([place])   . Nasal septal defect   . Penile cancer ([place]) 2014  . Polyarthropathy   . S/P trigger finger release    bilateral  . Sleep apnea   . Urinary outflow obstruction     Surgical History: Past Surgical History:  Procedure Laterality Date  . APPENDECTOMY    . CARDIAC CATHETERIZATION     with stent  . CARPAL TUNNEL RELEASE Bilateral   . COLONOSCOPY    . COLONOSCOPY WITH PROPOFOL N/A 10/03/2014   Procedure: COLONOSCOPY WITH PROPOFOL;  Surgeon: Manya Silvas, MD;  Location: [facility] ENDOSCOPY;   Service: Endoscopy;  Laterality: N/A;  . ESOPHAGOGASTRODUODENOSCOPY    . GANGLION CYST EXCISION    . PENILE BIOPSY  2014   Squamous Cell Carcinoma  . ROTATOR CUFF REPAIR    . TONSILLECTOMY      Home Medications:  Allergies as of 05/16/2017   No Known Allergies     Medication List        Accurate as of 05/16/17  2:28 PM. Always use your most recent med list.          allopurinol 300 MG tablet Commonly known as:  ZYLOPRIM Take 300 mg by mouth daily.   amLODipine-benazepril 5-20 MG capsule Commonly known as:  LOTREL Take 1 capsule by mouth daily.   aspirin 325 MG tablet Take 325 mg by mouth daily.   cilostazol 100 MG tablet Commonly known as:  PLETAL Take 100 mg by mouth 2 (two) times daily.   escitalopram 10 MG tablet Commonly known as:  LEXAPRO Take by mouth.   hydroxychloroquine 200 MG tablet Commonly known as:  PLAQUENIL Take by mouth daily.   predniSONE 50 MG tablet Commonly known as:  DELTASONE 1 tablet daily x 5 days.   rosuvastatin 5 MG tablet Commonly known as:  CRESTOR Take 5 mg by mouth daily.   sildenafil 20 MG tablet Commonly known as:  REVATIO Take 1 tablet (20  mg total) by mouth as needed. Take 1-5 tabs as needed prior to intercourse   tamsulosin 0.4 MG Caps capsule Commonly known as:  FLOMAX Take by mouth.       Allergies: No Known Allergies  Family History: No family history on file.  Social History:  reports that he has quit smoking. His smoking use included cigarettes. He has never used smokeless tobacco. He reports that he does not drink alcohol or use drugs.  ROS: UROLOGY Frequent Urination?: No Hard to postpone urination?: Yes Burning/pain with urination?: No Get up at night to urinate?: No Leakage of urine?: No Urine stream starts and stops?: No Trouble starting stream?: No Do you have to strain to urinate?: No Blood in urine?: No Urinary tract infection?: No Sexually transmitted disease?: No Injury to kidneys or  bladder?: No Painful intercourse?: No Weak stream?: No Erection problems?: No Penile pain?: No  Gastrointestinal Nausea?: No Vomiting?: No Indigestion/heartburn?: No Diarrhea?: No Constipation?: No  Constitutional Fever: No Night sweats?: No Weight loss?: No Fatigue?: No  Skin Skin rash/lesions?: No Itching?: No  Eyes Blurred vision?: No Double vision?: No  Ears/Nose/Throat Sore throat?: No Sinus problems?: Yes  Hematologic/Lymphatic Swollen glands?: No Easy bruising?: Yes  Cardiovascular Leg swelling?: No Chest pain?: No  Respiratory Cough?: No Shortness of breath?: Yes  Endocrine Excessive thirst?: No  Musculoskeletal Back pain?: No Joint pain?: Yes  Neurological Headaches?: No Dizziness?: No  Psychologic Depression?: No Anxiety?: No  Physical Exam: BP 119/71   Pulse 72   Constitutional:  Alert and oriented, No acute distress. HEENT: [place] AT, moist mucus membranes.  Trachea midline, no masses. Cardiovascular: No clubbing, cyanosis, or edema. Respiratory: Normal respiratory effort, no increased work of breathing. GI: Abdomen is soft, nontender, nondistended, no abdominal masses. Obese. Skin: No rashes, bruises or suspicious lesions. Neurologic: Grossly intact, no focal deficits, moving all 4 extremities. Psychiatric: Normal mood and affect.  Laboratory Data: Lab Results  Component Value Date   WBC 12.1 (H) 05/15/2011   HGB 13.6 05/15/2011   HCT 41.4 05/15/2011   MCV 98 05/15/2011   PLT 191 05/15/2011    Lab Results  Component Value Date   CREATININE 1.56 (H) 05/14/2011    Urinalysis N/a  Pertinent Imaging: N/a  Assessment & Plan:    1. Elevated PSA Will continue to follow with serial PSA/rectal exam PSA today pending, will call with results and recommendations about timing of follow-up-given his age and comorbidities, will likely to continue to follow him conservatively  2. Benign prostatic hyperplasia (BPH) with urinary  urgency Continue Flomax Symptoms stable  3. History of penile cancer No evidence of recurrence on exam today Followed by Dr. Evorn Gong  4. Erectile dysfunction  Trial of PDE 5 inhibitor, no contraindications Will call in generic sildenafil, titrate from 1 tab up to 5 tabs as needed Side effects were discussed in detail   Will call with PSA results  Hollice Espy, MD  [facility] [address] [place], [place] [address] [phone]

## 2017-05-22 ENCOUNTER — Telehealth: Payer: Self-pay | Admitting: Family Medicine

## 2017-05-22 ENCOUNTER — Ambulatory Visit: Payer: Medicare Other | Admitting: Urology

## 2017-05-22 NOTE — Telephone Encounter (Signed)
Pt returned phone call, LMOM asking for someone to call him about "test" results @ [phone] stated a VM could be left on that number as he is the only one with that phone.  Please Advise.  Thanks.

## 2017-05-22 NOTE — Telephone Encounter (Signed)
LMOM for patient to return call.

## 2017-05-22 NOTE — Telephone Encounter (Signed)
-----   Message from Hollice Espy, MD sent at 05/22/2017 12:21 PM EDT ----- Please let Mr. Quintanar know that his PSA is up from 6 months ago, up to 7 from 5.  It has been higher in the past.  I would like to continue to follow him with PSA/rectal exam to be repeated in 6 months.  Please arrange this follow-up and have him get his PSA at least 1-2 days prior to the appointment.  Hollice Espy, MD

## 2017-05-23 NOTE — Telephone Encounter (Signed)
Spoke w/ pt, informed him of psa results. Pt states understand and would like to schedule 6 month f/u  psa/rectal exam with psa 1-2 days prior. Please call to schedule.

## 2017-05-27 NOTE — Telephone Encounter (Signed)
App has been made for the follow u p but we just need to make sure the lab order is in because he will be going to [place] to get them done and they do not need an app for that.   Thanks, Sharyn Lull

## 2017-05-29 ENCOUNTER — Other Ambulatory Visit: Payer: Self-pay

## 2017-05-29 DIAGNOSIS — R972 Elevated prostate specific antigen [PSA]: Secondary | ICD-10-CM

## 2017-05-29 NOTE — Telephone Encounter (Signed)
Orders placed.

## 2017-06-06 ENCOUNTER — Other Ambulatory Visit: Payer: Self-pay | Admitting: Orthopedic Surgery

## 2017-06-06 DIAGNOSIS — M71322 Other bursal cyst, left elbow: Secondary | ICD-10-CM

## 2017-06-06 DIAGNOSIS — M7989 Other specified soft tissue disorders: Secondary | ICD-10-CM

## 2017-06-12 ENCOUNTER — Ambulatory Visit
Admission: RE | Admit: 2017-06-12 | Discharge: 2017-06-12 | Disposition: A | Discharge status: Home / Self Care | Payer: Medicare Other | Source: Ambulatory Visit | Attending: Orthopedic Surgery | Admitting: Orthopedic Surgery

## 2017-06-12 ENCOUNTER — Encounter (INDEPENDENT_AMBULATORY_CARE_PROVIDER_SITE_OTHER): Payer: Self-pay

## 2017-06-12 DIAGNOSIS — M7989 Other specified soft tissue disorders: Secondary | ICD-10-CM | POA: Diagnosis not present

## 2017-06-12 DIAGNOSIS — M799 Soft tissue disorder, unspecified: Secondary | ICD-10-CM | POA: Diagnosis present

## 2017-06-12 DIAGNOSIS — M71322 Other bursal cyst, left elbow: Secondary | ICD-10-CM | POA: Diagnosis present

## 2017-06-12 DIAGNOSIS — S56512A Strain of other extensor muscle, fascia and tendon at forearm level, left arm, initial encounter: Secondary | ICD-10-CM | POA: Diagnosis not present

## 2017-06-12 MED ORDER — GADOBENATE DIMEGLUMINE 529 MG/ML IV SOLN
10.0000 mL | Freq: Once | INTRAVENOUS | Status: AC | PRN
  Administered 2017-06-12: 10 mL via INTRAVENOUS

## 2017-06-12 MED ORDER — GADOBENATE DIMEGLUMINE 529 MG/ML IV SOLN: 18.0000 mL | Freq: Once | INTRAVENOUS | Status: DC | PRN

## 2017-09-05 ENCOUNTER — Other Ambulatory Visit: Payer: Self-pay | Admitting: Rheumatology

## 2017-09-05 DIAGNOSIS — M25511 Pain in right shoulder: Secondary | ICD-10-CM

## 2017-09-18 ENCOUNTER — Ambulatory Visit
Admission: RE | Admit: 2017-09-18 | Discharge: 2017-09-18 | Disposition: A | Discharge status: Home / Self Care | Payer: Medicare Other | Source: Ambulatory Visit | Attending: Rheumatology | Admitting: Rheumatology

## 2017-09-18 DIAGNOSIS — X58XXXA Exposure to other specified factors, initial encounter: Secondary | ICD-10-CM | POA: Diagnosis not present

## 2017-09-18 DIAGNOSIS — M659 Synovitis and tenosynovitis, unspecified: Secondary | ICD-10-CM | POA: Diagnosis not present

## 2017-09-18 DIAGNOSIS — M75121 Complete rotator cuff tear or rupture of right shoulder, not specified as traumatic: Secondary | ICD-10-CM | POA: Insufficient documentation

## 2017-09-18 DIAGNOSIS — S46121A Laceration of muscle, fascia and tendon of long head of biceps, right arm, initial encounter: Secondary | ICD-10-CM | POA: Insufficient documentation

## 2017-09-18 DIAGNOSIS — G8929 Other chronic pain: Secondary | ICD-10-CM | POA: Insufficient documentation

## 2017-09-18 DIAGNOSIS — M25511 Pain in right shoulder: Secondary | ICD-10-CM | POA: Diagnosis not present

## 2017-10-23 ENCOUNTER — Encounter: Payer: Self-pay | Admitting: Emergency Medicine

## 2017-10-23 ENCOUNTER — Other Ambulatory Visit: Payer: Self-pay

## 2017-10-23 ENCOUNTER — Ambulatory Visit
Admission: EM | Admit: 2017-10-23 | Discharge: 2017-10-23 | Disposition: A | Discharge status: Home / Self Care | Payer: Medicare Other | Attending: Family Medicine | Admitting: Family Medicine

## 2017-10-23 ENCOUNTER — Ambulatory Visit: Payer: Medicare Other

## 2017-10-23 DIAGNOSIS — I2510 Atherosclerotic heart disease of native coronary artery without angina pectoris: Secondary | ICD-10-CM | POA: Diagnosis not present

## 2017-10-23 DIAGNOSIS — M109 Gout, unspecified: Secondary | ICD-10-CM | POA: Diagnosis not present

## 2017-10-23 DIAGNOSIS — M25512 Pain in left shoulder: Secondary | ICD-10-CM | POA: Insufficient documentation

## 2017-10-23 DIAGNOSIS — J45909 Unspecified asthma, uncomplicated: Secondary | ICD-10-CM | POA: Insufficient documentation

## 2017-10-23 DIAGNOSIS — M130 Polyarthritis, unspecified: Secondary | ICD-10-CM | POA: Diagnosis not present

## 2017-10-23 DIAGNOSIS — M069 Rheumatoid arthritis, unspecified: Secondary | ICD-10-CM | POA: Insufficient documentation

## 2017-10-23 DIAGNOSIS — M12812 Other specific arthropathies, not elsewhere classified, left shoulder: Secondary | ICD-10-CM

## 2017-10-23 DIAGNOSIS — I252 Old myocardial infarction: Secondary | ICD-10-CM | POA: Diagnosis not present

## 2017-10-23 DIAGNOSIS — M19012 Primary osteoarthritis, left shoulder: Secondary | ICD-10-CM | POA: Diagnosis not present

## 2017-10-23 DIAGNOSIS — G4730 Sleep apnea, unspecified: Secondary | ICD-10-CM | POA: Diagnosis not present

## 2017-10-23 DIAGNOSIS — E785 Hyperlipidemia, unspecified: Secondary | ICD-10-CM | POA: Insufficient documentation

## 2017-10-23 DIAGNOSIS — Z87891 Personal history of nicotine dependence: Secondary | ICD-10-CM | POA: Diagnosis not present

## 2017-10-23 DIAGNOSIS — I10 Essential (primary) hypertension: Secondary | ICD-10-CM | POA: Insufficient documentation

## 2017-10-23 DIAGNOSIS — M19019 Primary osteoarthritis, unspecified shoulder: Secondary | ICD-10-CM

## 2017-10-23 DIAGNOSIS — Z79899 Other long term (current) drug therapy: Secondary | ICD-10-CM | POA: Diagnosis not present

## 2017-10-23 DIAGNOSIS — M13812 Other specified arthritis, left shoulder: Secondary | ICD-10-CM | POA: Insufficient documentation

## 2017-10-23 MED ORDER — LIDOCAINE HCL (PF) 1 % IJ SOLN
6.0000 mL | Freq: Once | INTRAMUSCULAR | Status: AC
Start: 2017-10-23 — End: 2017-10-23
  Administered 2017-10-23: 6 mL

## 2017-10-23 MED ORDER — TRAMADOL HCL 50 MG PO TABS: 50.0000 mg | ORAL_TABLET | Freq: Two times a day (BID) | ORAL | 0 refills | Status: DC | PRN

## 2017-10-23 MED ORDER — METHYLPREDNISOLONE SODIUM SUCC 40 MG IJ SOLR
80.0000 mg | Freq: Once | INTRAMUSCULAR | Status: AC
  Administered 2017-10-23: 80 mg via INTRAMUSCULAR

## 2017-10-23 NOTE — ED Triage Notes (Signed)
Patient in today c/o left shoulder pain x 3 days, getting worse. Patient has been using OTC Aleve without relief.f No injury noted.

## 2017-10-23 NOTE — Discharge Instructions (Signed)
Follow up with your Orthopedist.  Take care  Dr. Lacinda Axon

## 2017-10-23 NOTE — ED Provider Notes (Signed)
[facility]    CSN: [id number] Arrival date & time: 10/23/17  1546  History   Chief Complaint Chief Complaint  Patient presents with  . Shoulder Pain    left   HPI  77 year old male with a complicated past medical history including polyarthropathy, rheumatoid arthritis, coronary artery disease presents with left shoulder pain.  She has long-standing history of multiple joint pains secondary to rheumatologic disease.  Patient also has ongoing issues with his right shoulder.  He is scheduled for shoulder surgery in November.  Patient states that he has had left shoulder pain for the past 2 to 3 days.  No fall, trauma, injury.  He reports that his pain is only with range of motion/activity.  No pain at rest.  He states that the pain is severe, 10/10 when it is triggered.  Located anteriorly per his report.  He reports decreased range of motion, particularly with behind the back activity.  Difficulty getting his hand to his back pocket.  He has taken Aleve without improvement.  He is currently on Plaquenil for RA.  Patient has not contacted his rheumatologist orthopedist regarding this problem.  No other medications or interventions tried.  No other complaints.  PMH, Surgical Hx, Family Hx, Social History reviewed and updated as below.  Past Medical History:  Diagnosis Date  . Anemia   . Arthritis   . Asthma   . Coronary artery disease   . Gout   . History of colon polyps   . Hyperlipidemia   . Hypertension   . Myocardial infarction ([place])   . Nasal septal defect   . Penile cancer ([place]) 2014  . Polyarthropathy   . S/P trigger finger release    bilateral  . Sleep apnea   . Urinary outflow obstruction    Past Surgical History:  Procedure Laterality Date  . APPENDECTOMY    . CARDIAC CATHETERIZATION     with stent  . CARPAL TUNNEL RELEASE Bilateral   . COLONOSCOPY    . COLONOSCOPY WITH PROPOFOL N/A 10/03/2014   Procedure: COLONOSCOPY WITH PROPOFOL;  Surgeon: Manya Silvas, MD;  Location: [facility] ENDOSCOPY;  Service: Endoscopy;  Laterality: N/A;  . ESOPHAGOGASTRODUODENOSCOPY    . GANGLION CYST EXCISION    . PENILE BIOPSY  2014   Squamous Cell Carcinoma  . ROTATOR CUFF REPAIR    . TONSILLECTOMY      Home Medications    Prior to Admission medications   Medication Sig Start Date End Date Taking? Authorizing Provider  allopurinol (ZYLOPRIM) 300 MG tablet Take 300 mg by mouth daily.   Yes [provider]  amLODipine-benazepril (LOTREL) 5-20 MG per capsule Take 1 capsule by mouth daily.   Yes [provider]  aspirin 325 MG tablet Take 325 mg by mouth daily.   Yes [provider]  cilostazol (PLETAL) 100 MG tablet Take 100 mg by mouth 2 (two) times daily.   Yes [provider]  escitalopram (LEXAPRO) 10 MG tablet Take by mouth. 09/13/16 10/23/17 Yes [provider]  hydroxychloroquine (PLAQUENIL) 200 MG tablet Take by mouth daily.   Yes [provider]  rosuvastatin (CRESTOR) 5 MG tablet Take 5 mg by mouth daily.   Yes [provider]  sildenafil (REVATIO) 20 MG tablet Take 1 tablet (20 mg total) by mouth as needed. Take 1-5 tabs as needed prior to intercourse 05/16/17  Yes Hollice Espy, MD  traMADol (ULTRAM) 50 MG tablet Take 1 tablet (50 mg total) by mouth  every 12 (twelve) hours as needed. 10/23/17   Coral Spikes, DO    Family History Family History  Problem Relation Age of Onset  . Stroke Mother   . Heart attack Father     Social History Social History   Tobacco Use  . Smoking status: Former Smoker    Types: Cigarettes    Last attempt to quit: 10/23/1992    Years since quitting: 25.0  . Smokeless tobacco: Never Used  Substance Use Topics  . Alcohol use: No  . Drug use: No     Allergies   Patient has no known allergies.   Review of Systems Review of Systems  Constitutional: Negative.   Musculoskeletal: Positive for arthralgias.       Left shoulder pain and decreased ROM.    Physical Exam Triage Vital Signs ED Triage Vitals  Enc Vitals Group     BP 10/23/17 1556 131/83     Pulse Rate 10/23/17 1556 79     Resp 10/23/17 1556 16     Temp 10/23/17 1556 98.5 F (36.9 C)     Temp Source 10/23/17 1556 Oral     SpO2 10/23/17 1556 96 %     Weight 10/23/17 1556 195 lb (88.5 kg)     Height 10/23/17 1556 5\' 5"  (1.651 m)     Head Circumference --      Peak Flow --      Pain Score 10/23/17 1555 10     Pain Loc --      Pain Edu? --      Excl. in [place]? --    Updated Vital Signs BP 131/83 (BP Location: Right Arm)   Pulse 79   Temp 98.5 F (36.9 C) (Oral)   Resp 16   Ht 5\' 5"  (1.651 m)   Wt 88.5 kg   SpO2 96%   BMI 32.45 kg/m   Visual Acuity Right Eye Distance:   Left Eye Distance:   Bilateral Distance:    Right Eye Near:   Left Eye Near:    Bilateral Near:     Physical Exam  Constitutional: He appears well-developed. No distress.  HENT:  Head: Normocephalic and atraumatic.  Cardiovascular: Normal rate and regular rhythm.  Pulmonary/Chest: Effort normal and breath sounds normal. No respiratory distress.  Musculoskeletal:  Left shoulder Inspection reveals no abnormalities, atrophy or asymmetry. Palpation with tenderness over the bicipital groove. ROM decreased in extension. 4/5 Supraspinatus strength.  Neurological: He is alert.  Psychiatric: He has a normal mood and affect. His behavior is normal.  Nursing note and vitals reviewed.  UC Treatments / Results  Labs (all labs ordered are listed, but only abnormal results are displayed) Labs Reviewed - No data to display  EKG None  Radiology Dg Shoulder Left  Result Date: 10/23/2017 CLINICAL DATA:  Shoulder pain EXAM: LEFT SHOULDER - 2+ VIEW COMPARISON:  None FINDINGS: No fracture or malalignment. Mild AC joint degenerative change. Marked narrowing of subacromial space, consistent with rotator cuff disease. Calcific tendinitis versus small loose bodies superior to the lateral humeral head.  Subchondral sclerosis and small cysts. Moderate glenohumeral degenerative change. IMPRESSION: 1. Moderate arthritis of the left shoulder without acute osseous abnormality 2. Marked narrowing of the subacromial space consistent with rotator cuff disease. Electronically Signed   By: Donavan Foil M.D.   On: 10/23/2017 16:34    Procedures Procedures (including critical care time) Procedure: Left subacromial bursa injection Consent signed and scanned into record. Medication:  80 mg of  Depo-Medrol, and 6 mL's of lidocaine 1% without epinephrine Preparation: area cleansed with alcohol x 3 Injection  Landmarks identified Above medication injected using a standard posterior approach. Patient tolerated well without bleeding or paresthesias   Medications Ordered in UC Medications  methylPREDNISolone sodium succinate (SOLU-MEDROL) 40 mg/mL injection 80 mg (has no administration in time range)  lidocaine (PF) (XYLOCAINE) 1 % injection 6 mL (has no administration in time range)    Initial Impression / Assessment and Plan / UC Course  I have reviewed the triage vital signs and the nursing notes.  Pertinent labs & imaging results that were available during my care of the patient were reviewed by me and considered in my medical decision making (see chart for details).    77 year old male presents with left shoulder pain.  X-ray revealed degenerative changes as well as rotator cuff disease.  Discussed treatment options with the patient and he elected for a shoulder injection.  Injection performed as above.  Additionally, tramadol for pain.  Advised follow-up with his orthopedic surgeon.  Final Clinical Impressions(s) / UC Diagnoses   Final diagnoses:  Rotator cuff arthropathy of left shoulder  Arthritis of shoulder     Discharge Instructions     Follow up with your Orthopedist.  Take care  Dr. Lacinda Axon     ED Prescriptions    Medication Sig Dispense Auth. Provider   traMADol (ULTRAM) 50 MG  tablet Take 1 tablet (50 mg total) by mouth every 12 (twelve) hours as needed. 10 tablet Coral Spikes, DO     Controlled Substance Prescriptions [place] Controlled Substance Registry consulted? Not Applicable   Coral Spikes, DO 10/23/17 1655

## 2017-10-27 ENCOUNTER — Other Ambulatory Visit: Payer: Self-pay | Admitting: Surgery

## 2017-10-27 DIAGNOSIS — M75122 Complete rotator cuff tear or rupture of left shoulder, not specified as traumatic: Secondary | ICD-10-CM

## 2017-10-27 DIAGNOSIS — M7582 Other shoulder lesions, left shoulder: Secondary | ICD-10-CM | POA: Insufficient documentation

## 2017-10-29 ENCOUNTER — Ambulatory Visit
Admission: RE | Admit: 2017-10-29 | Discharge: 2017-10-29 | Disposition: A | Discharge status: Home / Self Care | Payer: Medicare Other | Source: Ambulatory Visit | Attending: Surgery | Admitting: Surgery

## 2017-10-29 DIAGNOSIS — M19012 Primary osteoarthritis, left shoulder: Secondary | ICD-10-CM | POA: Insufficient documentation

## 2017-10-29 DIAGNOSIS — M75122 Complete rotator cuff tear or rupture of left shoulder, not specified as traumatic: Secondary | ICD-10-CM

## 2017-10-29 DIAGNOSIS — M75112 Incomplete rotator cuff tear or rupture of left shoulder, not specified as traumatic: Secondary | ICD-10-CM | POA: Insufficient documentation

## 2017-10-29 DIAGNOSIS — M659 Synovitis and tenosynovitis, unspecified: Secondary | ICD-10-CM | POA: Insufficient documentation

## 2017-10-29 DIAGNOSIS — M25412 Effusion, left shoulder: Secondary | ICD-10-CM | POA: Diagnosis not present

## 2017-10-29 DIAGNOSIS — M24012 Loose body in left shoulder: Secondary | ICD-10-CM | POA: Insufficient documentation

## 2017-11-06 ENCOUNTER — Other Ambulatory Visit: Payer: Self-pay

## 2017-11-06 ENCOUNTER — Inpatient Hospital Stay: Admission: RE | Admit: 2017-11-06 | Payer: Medicare Other | Source: Ambulatory Visit

## 2017-11-06 ENCOUNTER — Encounter
Admission: RE | Admit: 2017-11-06 | Discharge: 2017-11-06 | Disposition: A | Discharge status: Home / Self Care | Payer: Medicare Other | Source: Ambulatory Visit | Attending: Surgery | Admitting: Surgery

## 2017-11-06 DIAGNOSIS — I10 Essential (primary) hypertension: Secondary | ICD-10-CM | POA: Diagnosis not present

## 2017-11-06 DIAGNOSIS — I444 Left anterior fascicular block: Secondary | ICD-10-CM | POA: Insufficient documentation

## 2017-11-06 DIAGNOSIS — I440 Atrioventricular block, first degree: Secondary | ICD-10-CM | POA: Insufficient documentation

## 2017-11-06 DIAGNOSIS — Z01818 Encounter for other preprocedural examination: Secondary | ICD-10-CM | POA: Insufficient documentation

## 2017-11-06 DIAGNOSIS — E785 Hyperlipidemia, unspecified: Secondary | ICD-10-CM | POA: Diagnosis not present

## 2017-11-06 HISTORY — DX: Anxiety disorder, unspecified: F41.9

## 2017-11-06 HISTORY — DX: Prediabetes: R73.03

## 2017-11-06 LAB — BASIC METABOLIC PANEL
ANION GAP: 8 (ref 5–15)
BUN: 36 mg/dL — ABNORMAL HIGH (ref 8–23)
CO2: 25 mmol/L (ref 22–32)
CREATININE: 1.79 mg/dL — AB (ref 0.61–1.24)
Calcium: 8.8 mg/dL — ABNORMAL LOW (ref 8.9–10.3)
Chloride: 108 mmol/L (ref 98–111)
GFR calc non Af Amer: 35 mL/min — ABNORMAL LOW (ref >60)
GFR, EST AFRICAN AMERICAN: 41 mL/min — AB (ref >60)
Glucose, Bld: 133 mg/dL — ABNORMAL HIGH (ref 70–99)
Potassium: 4.7 mmol/L (ref 3.5–5.1)
Sodium: 141 mmol/L (ref 135–145)

## 2017-11-06 LAB — URINALYSIS, ROUTINE W REFLEX MICROSCOPIC
BILIRUBIN URINE: NEGATIVE
Glucose, UA: NEGATIVE mg/dL
Hgb urine dipstick: NEGATIVE
KETONES UR: NEGATIVE mg/dL
LEUKOCYTES UA: NEGATIVE
NITRITE: NEGATIVE
PH: 5 (ref 5.0–8.0)
Protein, ur: NEGATIVE mg/dL
SPECIFIC GRAVITY, URINE: 1.017 (ref 1.005–1.030)

## 2017-11-06 LAB — TYPE AND SCREEN
ABO/RH(D): B NEG
Antibody Screen: NEGATIVE

## 2017-11-06 LAB — CBC
HEMATOCRIT: 43.1 % (ref 40.0–52.0)
HEMOGLOBIN: 14.7 g/dL (ref 13.0–18.0)
MCH: 30.5 pg (ref 26.0–34.0)
MCHC: 34 g/dL (ref 32.0–36.0)
MCV: 89.5 fL (ref 80.0–100.0)
Platelets: 208 10*3/uL (ref 150–440)
RBC: 4.82 MIL/uL (ref 4.40–5.90)
RDW: 14.2 % (ref 11.5–14.5)
WBC: 9.9 10*3/uL (ref 3.8–10.6)

## 2017-11-06 LAB — SURGICAL PCR SCREEN
MRSA, PCR: NEGATIVE
STAPHYLOCOCCUS AUREUS: NEGATIVE

## 2017-11-06 NOTE — Patient Instructions (Signed)
Your procedure is scheduled on: Tuesday 11/11/17 Report to [facility]. To find out your arrival time please call [phone] between 1PM - 3PM on Monday 11/10/17.  Remember: Instructions that are not followed completely may result in serious medical risk, up to and including death, or upon the discretion of your surgeon and anesthesiologist your surgery may need to be rescheduled.     _X__ 1. Do not eat food after midnight the night before your procedure.                 No gum chewing or hard candies. You may drink clear liquids up to 2 hours                 before you are scheduled to arrive for your surgery- DO not drink clear                 liquids within 2 hours of the start of your surgery.                 Clear Liquids include:  water, apple juice without pulp, clear carbohydrate                 drink such as Clearfast or Gatorade, Black Coffee or Tea (Do not add                 anything to coffee or tea).  __X__2.  On the morning of surgery brush your teeth with toothpaste and water, you                 may rinse your mouth with mouthwash if you wish.  Do not swallow any              toothpaste of mouthwash.     _X__ 3.  No Alcohol for 24 hours before or after surgery.   _X__ 4.  Do Not Smoke or use e-cigarettes For 24 Hours Prior to Your Surgery.                 Do not use any chewable tobacco products for at least 6 hours prior to                 surgery.  ____  5.  Bring all medications with you on the day of surgery if instructed.   __X__  6.  Notify your doctor if there is any change in your medical condition      (cold, fever, infections).     Do not wear jewelry, make-up, hairpins, clips or nail polish. Do not wear lotions, powders, or perfumes.  Do not shave 48 hours prior to surgery. Men may shave face and neck. Do not bring valuables to the hospital.    [facility] is not responsible for any belongings or  valuables.  Contacts, dentures/partials or body piercings may not be worn into surgery. Bring a case for your contacts, glasses or hearing aids, a denture cup will be supplied. Leave your suitcase in the car. After surgery it may be brought to your room. For patients admitted to the hospital, discharge time is determined by your treatment team.   Patients discharged the day of surgery will not be allowed to drive home.   Please read over the following fact sheets that you were given:   MRSA Information  __X__ Take these medicines the morning of surgery with A SIP OF WATER:  1. escitalopram (LEXAPRO)  2. rosuvastatin (CRESTOR)   3. tamsulosin (FLOMAX)   4. May take pain medicine if needed  5.  6.  ____ Fleet Enema (as directed)   __X__ Use CHG Soap/SAGE wipes as directed  ____ Use inhalers on the day of surgery  ____ Stop metformin/Janumet/Farxiga 2 days prior to surgery    ____ Take 1/2 of usual insulin dose the night before surgery. No insulin the morning          of surgery.   __X__ Stop Blood Thinners Coumadin/Plavix/Xarelto/Pleta/Pradaxa/Eliquis/Effient/Aspirin  on   Or contact your Surgeon, Cardiologist or Medical Doctor regarding  ability to stop your blood thinners   __X__ Stop Anti-inflammatories 7 days before surgery such as Advil, Ibuprofen, Motrin,  BC or Goodies Powder, Naprosyn, Naproxen, Aleve (YOU MAY TAKE TYLENOL)   __X__ Stop all herbal supplements, fish oil or vitamin E until after surgery.    ____ Bring C-Pap to the hospital.    CONTACT DR POGGI ABOUT CONTINUING ARTHRITIS MEDS PRIOR TO SURGERY. tHEY CAN ALSO CONTACT DR PARASCHOS ABOUT YOUR ASPIRIN.

## 2017-11-06 NOTE — Pre-Procedure Instructions (Signed)
AS INSTRUCTED BY DR P CARROLL, EKG/REQUEST FOR CLEARANCE CALLED AND FAXED TO DR PARASCHOS AND FAXED FYI TO DR Roland Rack

## 2017-11-07 LAB — URINE CULTURE: Culture: NO GROWTH

## 2017-11-11 ENCOUNTER — Encounter: Payer: Self-pay | Admitting: Emergency Medicine

## 2017-11-11 ENCOUNTER — Inpatient Hospital Stay: Payer: Medicare Other | Admitting: Anesthesiology

## 2017-11-11 ENCOUNTER — Inpatient Hospital Stay: Payer: Medicare Other

## 2017-11-11 ENCOUNTER — Encounter
Admission: RE | Disposition: A | Discharge status: Home / Self Care | Payer: Self-pay | Source: Ambulatory Visit | Attending: Surgery

## 2017-11-11 ENCOUNTER — Other Ambulatory Visit: Payer: Self-pay

## 2017-11-11 ENCOUNTER — Inpatient Hospital Stay
Admission: RE | Admit: 2017-11-11 | Discharge: 2017-11-12 | DRG: 483 | Disposition: A | Discharge status: Home / Self Care | Payer: Medicare Other | Source: Ambulatory Visit | Attending: Surgery | Admitting: Surgery

## 2017-11-11 DIAGNOSIS — G4730 Sleep apnea, unspecified: Secondary | ICD-10-CM | POA: Diagnosis present

## 2017-11-11 DIAGNOSIS — I252 Old myocardial infarction: Secondary | ICD-10-CM | POA: Diagnosis not present

## 2017-11-11 DIAGNOSIS — M19012 Primary osteoarthritis, left shoulder: Secondary | ICD-10-CM | POA: Diagnosis present

## 2017-11-11 DIAGNOSIS — J45909 Unspecified asthma, uncomplicated: Secondary | ICD-10-CM | POA: Diagnosis present

## 2017-11-11 DIAGNOSIS — M779 Enthesopathy, unspecified: Secondary | ICD-10-CM | POA: Diagnosis present

## 2017-11-11 DIAGNOSIS — Z8719 Personal history of other diseases of the digestive system: Secondary | ICD-10-CM

## 2017-11-11 DIAGNOSIS — M75102 Unspecified rotator cuff tear or rupture of left shoulder, not specified as traumatic: Secondary | ICD-10-CM | POA: Diagnosis present

## 2017-11-11 DIAGNOSIS — E785 Hyperlipidemia, unspecified: Secondary | ICD-10-CM | POA: Diagnosis present

## 2017-11-11 DIAGNOSIS — Z96612 Presence of left artificial shoulder joint: Secondary | ICD-10-CM

## 2017-11-11 DIAGNOSIS — Z8549 Personal history of malignant neoplasm of other male genital organs: Secondary | ICD-10-CM | POA: Diagnosis not present

## 2017-11-11 DIAGNOSIS — I2510 Atherosclerotic heart disease of native coronary artery without angina pectoris: Secondary | ICD-10-CM | POA: Diagnosis present

## 2017-11-11 DIAGNOSIS — M109 Gout, unspecified: Secondary | ICD-10-CM | POA: Diagnosis present

## 2017-11-11 DIAGNOSIS — I10 Essential (primary) hypertension: Secondary | ICD-10-CM | POA: Diagnosis present

## 2017-11-11 DIAGNOSIS — F419 Anxiety disorder, unspecified: Secondary | ICD-10-CM | POA: Diagnosis present

## 2017-11-11 HISTORY — PX: REVERSE SHOULDER ARTHROPLASTY: SHX5054

## 2017-11-11 LAB — ABO/RH: ABO/RH(D): B NEG

## 2017-11-11 SURGERY — ARTHROPLASTY, SHOULDER, TOTAL, REVERSE
Anesthesia: General | Site: Shoulder | Laterality: Left

## 2017-11-11 MED ORDER — PANTOPRAZOLE SODIUM 40 MG PO TBEC
40.0000 mg | DELAYED_RELEASE_TABLET | Freq: Every day | ORAL | Status: DC
  Administered 2017-11-12: 40 mg via ORAL
  Filled 2017-11-11: qty 1

## 2017-11-11 MED ORDER — CEFAZOLIN SODIUM-DEXTROSE 2-4 GM/100ML-% IV SOLN
2.0000 g | Freq: Once | INTRAVENOUS | Status: AC
  Administered 2017-11-11: 2 g via INTRAVENOUS

## 2017-11-11 MED ORDER — PROPOFOL 10 MG/ML IV BOLUS
INTRAVENOUS | Status: AC
  Filled 2017-11-11: qty 20

## 2017-11-11 MED ORDER — TAMSULOSIN HCL 0.4 MG PO CAPS
0.4000 mg | ORAL_CAPSULE | Freq: Every day | ORAL | Status: DC
  Administered 2017-11-12: 0.4 mg via ORAL
  Filled 2017-11-11: qty 1

## 2017-11-11 MED ORDER — MIDAZOLAM HCL 2 MG/2ML IJ SOLN
INTRAMUSCULAR | Status: DC | PRN
  Administered 2017-11-11: 2 mg via INTRAVENOUS

## 2017-11-11 MED ORDER — VASOPRESSIN 20 UNIT/ML IV SOLN
INTRAVENOUS | Status: DC | PRN
  Administered 2017-11-11: 2 [IU] via INTRAVENOUS

## 2017-11-11 MED ORDER — DIPHENHYDRAMINE HCL 12.5 MG/5ML PO ELIX: 12.5000 mg | ORAL_SOLUTION | ORAL | Status: DC | PRN

## 2017-11-11 MED ORDER — LACTATED RINGERS IV SOLN
INTRAVENOUS | Status: DC
  Administered 2017-11-11: 09:00:00 via INTRAVENOUS

## 2017-11-11 MED ORDER — MIDAZOLAM HCL 2 MG/2ML IJ SOLN
1.0000 mg | Freq: Once | INTRAMUSCULAR | Status: AC
  Administered 2017-11-11: 1 mg via INTRAVENOUS

## 2017-11-11 MED ORDER — BUPIVACAINE-EPINEPHRINE (PF) 0.5% -1:200000 IJ SOLN
INTRAMUSCULAR | Status: DC | PRN
  Administered 2017-11-11: 30 mL

## 2017-11-11 MED ORDER — ONDANSETRON HCL 4 MG/2ML IJ SOLN: 4.0000 mg | Freq: Four times a day (QID) | INTRAMUSCULAR | Status: DC | PRN

## 2017-11-11 MED ORDER — ACETAMINOPHEN 325 MG PO TABS: 325.0000 mg | ORAL_TABLET | Freq: Four times a day (QID) | ORAL | Status: DC | PRN

## 2017-11-11 MED ORDER — NEOMYCIN-POLYMYXIN B GU 40-200000 IR SOLN
Status: DC | PRN
  Administered 2017-11-11: 14 mL

## 2017-11-11 MED ORDER — LIDOCAINE HCL (PF) 1 % IJ SOLN
INTRAMUSCULAR | Status: AC
  Filled 2017-11-11: qty 5

## 2017-11-11 MED ORDER — BUPIVACAINE LIPOSOME 1.3 % IJ SUSP
INTRAMUSCULAR | Status: DC | PRN
  Administered 2017-11-11: 20 mL via PERINEURAL

## 2017-11-11 MED ORDER — METOCLOPRAMIDE HCL 10 MG PO TABS: 5.0000 mg | ORAL_TABLET | Freq: Three times a day (TID) | ORAL | Status: DC | PRN

## 2017-11-11 MED ORDER — SODIUM CHLORIDE 0.9 % IV SOLN
INTRAVENOUS | Status: DC
  Administered 2017-11-11 – 2017-11-12 (×2): via INTRAVENOUS

## 2017-11-11 MED ORDER — HYDROXYCHLOROQUINE SULFATE 200 MG PO TABS
100.0000 mg | ORAL_TABLET | Freq: Every day | ORAL | Status: DC
  Administered 2017-11-12: 100 mg via ORAL
  Filled 2017-11-11: qty 1

## 2017-11-11 MED ORDER — LACTATED RINGERS IV SOLN
INTRAVENOUS | Status: DC | PRN
  Administered 2017-11-11: 11:00:00 via INTRAVENOUS

## 2017-11-11 MED ORDER — CEFAZOLIN SODIUM-DEXTROSE 2-4 GM/100ML-% IV SOLN
INTRAVENOUS | Status: AC
  Filled 2017-11-11: qty 100

## 2017-11-11 MED ORDER — FENTANYL CITRATE (PF) 100 MCG/2ML IJ SOLN
INTRAMUSCULAR | Status: AC
  Filled 2017-11-11: qty 2

## 2017-11-11 MED ORDER — BUPIVACAINE HCL (PF) 0.5 % IJ SOLN
INTRAMUSCULAR | Status: DC | PRN
  Administered 2017-11-11: 10 mL via PERINEURAL

## 2017-11-11 MED ORDER — FAMOTIDINE 20 MG PO TABS
ORAL_TABLET | ORAL | Status: AC
  Administered 2017-11-11: 20 mg via ORAL
  Filled 2017-11-11: qty 1

## 2017-11-11 MED ORDER — ROCURONIUM BROMIDE 100 MG/10ML IV SOLN
INTRAVENOUS | Status: DC | PRN
  Administered 2017-11-11: 50 mg via INTRAVENOUS
  Administered 2017-11-11: 20 mg via INTRAVENOUS

## 2017-11-11 MED ORDER — FENTANYL CITRATE (PF) 100 MCG/2ML IJ SOLN
INTRAMUSCULAR | Status: AC
  Administered 2017-11-11: 50 ug via INTRAVENOUS
  Filled 2017-11-11: qty 2

## 2017-11-11 MED ORDER — ROSUVASTATIN CALCIUM 10 MG PO TABS
20.0000 mg | ORAL_TABLET | Freq: Every day | ORAL | Status: DC
  Administered 2017-11-12: 20 mg via ORAL
  Filled 2017-11-11: qty 2

## 2017-11-11 MED ORDER — FAMOTIDINE 20 MG PO TABS
20.0000 mg | ORAL_TABLET | Freq: Once | ORAL | Status: AC
  Administered 2017-11-11: 20 mg via ORAL

## 2017-11-11 MED ORDER — FENTANYL CITRATE (PF) 100 MCG/2ML IJ SOLN
50.0000 ug | Freq: Once | INTRAMUSCULAR | Status: AC
  Administered 2017-11-11: 50 ug via INTRAVENOUS

## 2017-11-11 MED ORDER — FENTANYL CITRATE (PF) 100 MCG/2ML IJ SOLN
INTRAMUSCULAR | Status: DC | PRN
  Administered 2017-11-11 (×2): 50 ug via INTRAVENOUS

## 2017-11-11 MED ORDER — ASPIRIN EC 325 MG PO TBEC
325.0000 mg | DELAYED_RELEASE_TABLET | Freq: Every day | ORAL | Status: DC
  Administered 2017-11-12: 325 mg via ORAL
  Filled 2017-11-11: qty 1

## 2017-11-11 MED ORDER — LIDOCAINE HCL (CARDIAC) PF 100 MG/5ML IV SOSY
PREFILLED_SYRINGE | INTRAVENOUS | Status: DC | PRN
  Administered 2017-11-11: 100 mg via INTRAVENOUS

## 2017-11-11 MED ORDER — CEFAZOLIN SODIUM-DEXTROSE 2-4 GM/100ML-% IV SOLN
2.0000 g | Freq: Four times a day (QID) | INTRAVENOUS | Status: AC
  Administered 2017-11-11 – 2017-11-12 (×3): 2 g via INTRAVENOUS
  Filled 2017-11-11 (×3): qty 100

## 2017-11-11 MED ORDER — PROPOFOL 10 MG/ML IV BOLUS
INTRAVENOUS | Status: DC | PRN
  Administered 2017-11-11: 130 mg via INTRAVENOUS

## 2017-11-11 MED ORDER — EPHEDRINE SULFATE 50 MG/ML IJ SOLN
INTRAMUSCULAR | Status: DC | PRN
  Administered 2017-11-11: 15 mg via INTRAVENOUS
  Administered 2017-11-11: 10 mg via INTRAVENOUS

## 2017-11-11 MED ORDER — LIDOCAINE HCL (PF) 1 % IJ SOLN
INTRAMUSCULAR | Status: DC | PRN
  Administered 2017-11-11: 5 mL via SUBCUTANEOUS

## 2017-11-11 MED ORDER — MAGNESIUM HYDROXIDE 400 MG/5ML PO SUSP: 30.0000 mL | Freq: Every day | ORAL | Status: DC | PRN

## 2017-11-11 MED ORDER — ALLOPURINOL 300 MG PO TABS
300.0000 mg | ORAL_TABLET | Freq: Every day | ORAL | Status: DC
  Administered 2017-11-12: 300 mg via ORAL
  Filled 2017-11-11: qty 1

## 2017-11-11 MED ORDER — TRANEXAMIC ACID 1000 MG/10ML IV SOLN
INTRAVENOUS | Status: DC | PRN
  Administered 2017-11-11: 10 mg via TOPICAL

## 2017-11-11 MED ORDER — ENOXAPARIN SODIUM 40 MG/0.4ML ~~LOC~~ SOLN
40.0000 mg | SUBCUTANEOUS | Status: DC
  Administered 2017-11-12: 40 mg via SUBCUTANEOUS
  Filled 2017-11-11: qty 0.4

## 2017-11-11 MED ORDER — ONDANSETRON HCL 4 MG/2ML IJ SOLN: 4.0000 mg | Freq: Once | INTRAMUSCULAR | Status: DC | PRN

## 2017-11-11 MED ORDER — METOCLOPRAMIDE HCL 5 MG/ML IJ SOLN: 5.0000 mg | Freq: Three times a day (TID) | INTRAMUSCULAR | Status: DC | PRN

## 2017-11-11 MED ORDER — BUPIVACAINE LIPOSOME 1.3 % IJ SUSP
INTRAMUSCULAR | Status: AC
  Filled 2017-11-11: qty 20

## 2017-11-11 MED ORDER — ONDANSETRON HCL 4 MG/2ML IJ SOLN
INTRAMUSCULAR | Status: DC | PRN
  Administered 2017-11-11: 4 mg via INTRAVENOUS

## 2017-11-11 MED ORDER — BENAZEPRIL HCL 20 MG PO TABS
20.0000 mg | ORAL_TABLET | Freq: Every day | ORAL | Status: DC
  Administered 2017-11-12: 20 mg via ORAL
  Filled 2017-11-11 (×2): qty 1

## 2017-11-11 MED ORDER — ONDANSETRON HCL 4 MG PO TABS: 4.0000 mg | ORAL_TABLET | Freq: Four times a day (QID) | ORAL | Status: DC | PRN

## 2017-11-11 MED ORDER — AMLODIPINE BESY-BENAZEPRIL HCL 5-20 MG PO CAPS: 1.0000 | ORAL_CAPSULE | Freq: Every day | ORAL | Status: DC

## 2017-11-11 MED ORDER — BISACODYL 10 MG RE SUPP: 10.0000 mg | Freq: Every day | RECTAL | Status: DC | PRN

## 2017-11-11 MED ORDER — LEFLUNOMIDE 20 MG PO TABS
10.0000 mg | ORAL_TABLET | Freq: Every day | ORAL | Status: DC
  Administered 2017-11-12: 10 mg via ORAL
  Filled 2017-11-11 (×2): qty 0.5

## 2017-11-11 MED ORDER — FLEET ENEMA 7-19 GM/118ML RE ENEM: 1.0000 | ENEMA | Freq: Once | RECTAL | Status: DC | PRN

## 2017-11-11 MED ORDER — SUGAMMADEX SODIUM 200 MG/2ML IV SOLN
INTRAVENOUS | Status: DC | PRN
  Administered 2017-11-11: 200 mg via INTRAVENOUS

## 2017-11-11 MED ORDER — DOCUSATE SODIUM 100 MG PO CAPS
100.0000 mg | ORAL_CAPSULE | Freq: Two times a day (BID) | ORAL | Status: DC
  Administered 2017-11-11 – 2017-11-12 (×2): 100 mg via ORAL
  Filled 2017-11-11 (×2): qty 1

## 2017-11-11 MED ORDER — AMLODIPINE BESYLATE 5 MG PO TABS
5.0000 mg | ORAL_TABLET | Freq: Every day | ORAL | Status: DC
  Administered 2017-11-12: 5 mg via ORAL
  Filled 2017-11-11: qty 1

## 2017-11-11 MED ORDER — HYDROMORPHONE HCL 1 MG/ML IJ SOLN: 0.5000 mg | INTRAMUSCULAR | Status: DC | PRN

## 2017-11-11 MED ORDER — FENTANYL CITRATE (PF) 100 MCG/2ML IJ SOLN: 25.0000 ug | INTRAMUSCULAR | Status: DC | PRN

## 2017-11-11 MED ORDER — ESCITALOPRAM OXALATE 10 MG PO TABS
10.0000 mg | ORAL_TABLET | Freq: Every day | ORAL | Status: DC
  Administered 2017-11-12: 10 mg via ORAL
  Filled 2017-11-11 (×2): qty 1

## 2017-11-11 MED ORDER — MIDAZOLAM HCL 2 MG/2ML IJ SOLN
INTRAMUSCULAR | Status: AC
  Filled 2017-11-11: qty 2

## 2017-11-11 MED ORDER — TRAMADOL HCL 50 MG PO TABS
50.0000 mg | ORAL_TABLET | Freq: Four times a day (QID) | ORAL | Status: DC | PRN
  Administered 2017-11-11: 50 mg via ORAL
  Filled 2017-11-11: qty 1

## 2017-11-11 MED ORDER — BUPIVACAINE HCL (PF) 0.5 % IJ SOLN
INTRAMUSCULAR | Status: AC
  Filled 2017-11-11: qty 10

## 2017-11-11 MED ORDER — MIDAZOLAM HCL 2 MG/2ML IJ SOLN
INTRAMUSCULAR | Status: AC
  Administered 2017-11-11: 1 mg via INTRAVENOUS
  Filled 2017-11-11: qty 2

## 2017-11-11 MED ORDER — OXYCODONE HCL 5 MG PO TABS: 5.0000 mg | ORAL_TABLET | ORAL | Status: DC | PRN

## 2017-11-11 MED ORDER — ACETAMINOPHEN 500 MG PO TABS
1000.0000 mg | ORAL_TABLET | Freq: Four times a day (QID) | ORAL | Status: AC
  Administered 2017-11-11 – 2017-11-12 (×4): 1000 mg via ORAL
  Filled 2017-11-11 (×4): qty 2

## 2017-11-11 SURGICAL SUPPLY — 65 items
BASEPLATE GLENOSPHERE 25 (Plate) ×2 IMPLANT
BASEPLATE GLENOSPHERE 25MM (Plate) ×1 IMPLANT
BEARING HUMERAL +3 40D (Joint) ×3 IMPLANT
BIT DRILL TWIST 2.7 (BIT) ×2 IMPLANT
BIT DRILL TWIST 2.7MM (BIT) ×1
BLADE SAGITTAL WIDE XTHICK NO (BLADE) ×3 IMPLANT
CANISTER SUCT 1200ML W/VALVE (MISCELLANEOUS) ×3 IMPLANT
CANISTER SUCT 3000ML PPV (MISCELLANEOUS) ×6 IMPLANT
CHLORAPREP W/TINT 26ML (MISCELLANEOUS) ×3 IMPLANT
COOLER POLAR GLACIER W/PUMP (MISCELLANEOUS) ×3 IMPLANT
CRADLE LAMINECT ARM (MISCELLANEOUS) ×3 IMPLANT
DIAL VERSA SHOULDER 40 STD (Joint) ×3 IMPLANT
DRAPE IMP U-DRAPE 54X76 (DRAPES) ×6 IMPLANT
DRAPE INCISE IOBAN 66X45 STRL (DRAPES) ×6 IMPLANT
DRAPE SHEET LG 3/4 BI-LAMINATE (DRAPES) ×6 IMPLANT
DRAPE TABLE BACK 80X90 (DRAPES) ×3 IMPLANT
DRSG OPSITE POSTOP 4X8 (GAUZE/BANDAGES/DRESSINGS) ×3 IMPLANT
ELECT BLADE 6.5 EXT (BLADE) IMPLANT
ELECT CAUTERY BLADE 6.4 (BLADE) ×3 IMPLANT
GLOVE BIO SURGEON STRL SZ7.5 (GLOVE) ×12 IMPLANT
GLOVE BIO SURGEON STRL SZ8 (GLOVE) ×12 IMPLANT
GLOVE BIOGEL PI IND STRL 8 (GLOVE) ×1 IMPLANT
GLOVE BIOGEL PI INDICATOR 8 (GLOVE) ×2
GLOVE INDICATOR 8.0 STRL GRN (GLOVE) ×3 IMPLANT
GOWN STRL REUS W/ TWL LRG LVL3 (GOWN DISPOSABLE) ×1 IMPLANT
GOWN STRL REUS W/ TWL XL LVL3 (GOWN DISPOSABLE) ×1 IMPLANT
GOWN STRL REUS W/TWL LRG LVL3 (GOWN DISPOSABLE) ×2
GOWN STRL REUS W/TWL XL LVL3 (GOWN DISPOSABLE) ×2
HOOD PEEL AWAY FLYTE STAYCOOL (MISCELLANEOUS) ×9 IMPLANT
KIT STABILIZATION SHOULDER (MISCELLANEOUS) ×3 IMPLANT
KIT TURNOVER KIT A (KITS) ×3 IMPLANT
MASK FACE SPIDER DISP (MASK) ×3 IMPLANT
MAT ABSORB  FLUID 56X50 GRAY (MISCELLANEOUS) ×2
MAT ABSORB FLUID 56X50 GRAY (MISCELLANEOUS) ×1 IMPLANT
NDL SAFETY ECLIPSE 18X1.5 (NEEDLE) ×1 IMPLANT
NEEDLE HYPO 18GX1.5 SHARP (NEEDLE) ×2
NEEDLE HYPO 22GX1.5 SAFETY (NEEDLE) ×3 IMPLANT
NEEDLE SPNL 20GX3.5 QUINCKE YW (NEEDLE) ×3 IMPLANT
NS IRRIG 500ML POUR BTL (IV SOLUTION) ×3 IMPLANT
PACK ARTHROSCOPY SHOULDER (MISCELLANEOUS) ×3 IMPLANT
PAD WRAPON POLAR SHDR UNIV (MISCELLANEOUS) ×1 IMPLANT
PIN THREADED REVERSE (PIN) ×3 IMPLANT
PULSAVAC PLUS IRRIG FAN TIP (DISPOSABLE) ×3
SCREW BONE CORT 6.5X35MM (Screw) ×3 IMPLANT
SCREW BONE LOCKING 4.75X30X3.5 (Screw) ×6 IMPLANT
SCREW NON-LOCK 4.75MMX15MM (Screw) ×3 IMPLANT
SCREW NON-LOCK 4.75MMX25MMX3.5 (Screw) ×3 IMPLANT
SLING ULTRA II M (MISCELLANEOUS) ×6 IMPLANT
SOL .9 NS 3000ML IRR  AL (IV SOLUTION) ×2
SOL .9 NS 3000ML IRR UROMATIC (IV SOLUTION) ×1 IMPLANT
SPONGE LAP 18X18 RF (DISPOSABLE) ×3 IMPLANT
STAPLER SKIN PROX 35W (STAPLE) ×3 IMPLANT
STEM HUMERAL STRL 14MMX140MM (Stem) ×3 IMPLANT
SUT ETHIBOND 0 MO6 C/R (SUTURE) ×3 IMPLANT
SUT FIBERWIRE #2 38 BLUE 1/2 (SUTURE) ×12
SUT VIC AB 0 CT1 36 (SUTURE) ×3 IMPLANT
SUT VIC AB 2-0 CT1 27 (SUTURE) ×4
SUT VIC AB 2-0 CT1 TAPERPNT 27 (SUTURE) ×2 IMPLANT
SUTURE FIBERWR #2 38 BLUE 1/2 (SUTURE) ×4 IMPLANT
SYR 10ML LL (SYRINGE) ×3 IMPLANT
SYR 30ML LL (SYRINGE) IMPLANT
TIP FAN IRRIG PULSAVAC PLUS (DISPOSABLE) ×1 IMPLANT
TRAY FOLEY MTR SLVR 16FR STAT (SET/KITS/TRAYS/PACK) ×3 IMPLANT
TRAY HUM MINI +0 40D +5 (Shoulder) ×3 IMPLANT
WRAPON POLAR PAD SHDR UNIV (MISCELLANEOUS) ×3

## 2017-11-11 NOTE — H&P (Signed)
Paper H&P to be scanned into permanent record. H&P reviewed and patient re-examined. No changes. 

## 2017-11-11 NOTE — Transfer of Care (Signed)
Immediate Anesthesia Transfer of Care Note  Patient: Jared Freeman  Procedure(s) Performed: REVERSE SHOULDER ARTHROPLASTY (Left Shoulder)  Patient Location: PACU  Anesthesia Type:General  Level of Consciousness: awake, alert  and oriented  Airway & Oxygen Therapy: Patient Spontanous Breathing and Patient connected to face mask oxygen  Post-op Assessment: Report given to RN  Post vital signs: Reviewed and stable  Last Vitals:  Vitals Value Taken Time  BP 100/70 11/11/2017  1:06 PM  Temp 36 C 11/11/2017  1:06 PM  Pulse 106 11/11/2017  1:06 PM  Resp 17 11/11/2017  1:06 PM  SpO2 99 % 11/11/2017  1:06 PM    Last Pain:  Vitals:   11/11/17 0823  TempSrc: Temporal  PainSc: 0-No pain         Complications: No apparent anesthesia complications

## 2017-11-11 NOTE — Anesthesia Post-op Follow-up Note (Signed)
Anesthesia QCDR form completed.        

## 2017-11-11 NOTE — Anesthesia Preprocedure Evaluation (Signed)
Anesthesia Evaluation  Patient identified by MRN, date of birth, ID band Patient awake    Reviewed: Allergy & Precautions, NPO status , Patient's Chart, lab work & pertinent test results  History of Anesthesia Complications Negative for: history of anesthetic complications  Airway Mallampati: II  TM Distance: >3 FB Neck ROM: Full    Dental  (+) Teeth Intact, Caps, Dental Advidsory Given   Pulmonary neg shortness of breath, asthma (as a child) , sleep apnea (not using now since weight loss) , Recent URI  (Currently on antibiotics), former smoker,           Cardiovascular hypertension, Pt. on medications (-) angina+ CAD, + Past MI and + Cardiac Stents  (-) CABG (-) dysrhythmias (-) Valvular Problems/Murmurs     Neuro/Psych PSYCHIATRIC DISORDERS Anxiety negative neurological ROS     GI/Hepatic negative GI ROS, Neg liver ROS,   Endo/Other  diabetes (diet controlled), Well Controlled  Renal/GU negative Renal ROS     Musculoskeletal   Abdominal   Peds  Hematology  (+) Blood dyscrasia (pt denies anemia), anemia ,   Anesthesia Other Findings Past Medical History: No date: Anemia No date: Anxiety No date: Arthritis No date: Asthma No date: Coronary artery disease No date: Gout No date: History of colon polyps No date: Hyperlipidemia No date: Hypertension No date: Myocardial infarction ([place]) No date: Nasal septal defect 2014: Penile cancer ([facility]) No date: Polyarthropathy No date: Pre-diabetes No date: S/P trigger finger release     Comment:  bilateral No date: Sleep apnea No date: Urinary outflow obstruction   Reproductive/Obstetrics                             Anesthesia Physical  Anesthesia Plan  ASA: III  Anesthesia Plan: General   Post-op Pain Management:  Regional for Post-op pain   Induction: Intravenous  PONV Risk Score and Plan: 2 and Ondansetron, Dexamethasone and  Treatment may vary due to age or medical condition  Airway Management Planned: Oral ETT  Additional Equipment:   Intra-op Plan:   Post-operative Plan: Extubation in OR  Informed Consent: I have reviewed the patients History and Physical, chart, labs and discussed the procedure including the risks, benefits and alternatives for the proposed anesthesia with the patient or authorized representative who has indicated his/her understanding and acceptance.     Plan Discussed with:   Anesthesia Plan Comments:         Anesthesia Quick Evaluation

## 2017-11-11 NOTE — NC FL2 (Signed)
[place] LEVEL OF CARE SCREENING TOOL     IDENTIFICATION  Patient Name: Jared Freeman Birthdate: 04-29-1940 Sex: male Admission Date (Current Location): 11/11/2017  [place] and [place] Number:  Engineering geologist and Address:  [facility], [address] [place], [place] [address]      Provider Number: [id number]  Attending Physician Name and Address:  Corky Mull, MD  Relative Name and Phone Number:       Current Level of Care: Hospital Recommended Level of Care: [place] Prior Approval Number:    Date Approved/Denied:   PASRR Number: [id number])  Discharge Plan: SNF    Current Diagnoses: Patient Active Problem List   Diagnosis Date Noted  . Status post reverse arthroplasty of shoulder, left 11/11/2017    Orientation RESPIRATION BLADDER Height & Weight     Self, Time, Situation, Place  O2(2 Liters Oxygen. ) Continent Weight: 192 lb (87.1 kg) Height:  5\' 5"  (165.1 cm)  BEHAVIORAL SYMPTOMS/MOOD NEUROLOGICAL BOWEL NUTRITION STATUS      Continent Diet(Diet: Clear Liquid to be advanced. )  AMBULATORY STATUS COMMUNICATION OF NEEDS Skin   Extensive Assist Verbally Surgical wounds(Incision: Left Shoulder )                       Personal Care Assistance Level of Assistance  Bathing, Feeding, Dressing Bathing Assistance: Limited assistance Feeding assistance: Independent Dressing Assistance: Limited assistance     Functional Limitations Info  Sight, Hearing, Speech Sight Info: Adequate Hearing Info: Adequate Speech Info: Adequate    SPECIAL CARE FACTORS FREQUENCY  PT (By licensed PT), OT (By licensed OT)     PT Frequency: (5) OT Frequency: (5)            Contractures      Additional Factors Info  Code Status, Allergies Code Status Info: (Full Code. ) Allergies Info: (No Known Allergies. )           Current Medications (11/11/2017):  This is the current hospital active  medication list Current Facility-Administered Medications  Medication Dose Route Frequency Provider Last Rate Last Dose  . 0.9 %  sodium chloride infusion   Intravenous Continuous Poggi, Marshall Cork, MD 75 mL/hr at 11/11/17 1421    . acetaminophen (TYLENOL) tablet 1,000 mg  1,000 mg Oral Q6H Poggi, Marshall Cork, MD      . Derrill Memo ON 11/12/2017] acetaminophen (TYLENOL) tablet 325-650 mg  325-650 mg Oral Q6H PRN Poggi, Marshall Cork, MD      . Derrill Memo ON 11/12/2017] allopurinol (ZYLOPRIM) tablet 300 mg  300 mg Oral Daily Poggi, Marshall Cork, MD      . amLODipine (NORVASC) tablet 5 mg  5 mg Oral Daily Poggi, Marshall Cork, MD       And  . benazepril (LOTENSIN) tablet 20 mg  20 mg Oral Daily Poggi, Marshall Cork, MD      . aspirin EC tablet 325 mg  325 mg Oral Daily Poggi, Marshall Cork, MD      . bisacodyl (DULCOLAX) suppository 10 mg  10 mg Rectal Daily PRN Poggi, Marshall Cork, MD      . ceFAZolin (ANCEF) IVPB 2g/100 mL premix  2 g Intravenous Q6H Poggi, Marshall Cork, MD      . diphenhydrAMINE (BENADRYL) 12.5 MG/5ML elixir 12.5-25 mg  12.5-25 mg Oral Q4H PRN Poggi, Marshall Cork, MD      . docusate sodium (COLACE) capsule 100 mg  100 mg Oral BID  Poggi, Marshall Cork, MD      . Derrill Memo ON 11/12/2017] enoxaparin (LOVENOX) injection 40 mg  40 mg Subcutaneous Q24H Poggi, Marshall Cork, MD      . escitalopram (LEXAPRO) tablet 10 mg  10 mg Oral Daily Poggi, Marshall Cork, MD      . HYDROmorphone (DILAUDID) injection 0.5-1 mg  0.5-1 mg Intravenous Q4H PRN Poggi, Marshall Cork, MD      . hydroxychloroquine (PLAQUENIL) tablet 100 mg  100 mg Oral Daily Poggi, Marshall Cork, MD      . leflunomide (ARAVA) tablet 10 mg  10 mg Oral Daily Poggi, Marshall Cork, MD      . magnesium hydroxide (MILK OF MAGNESIA) suspension 30 mL  30 mL Oral Daily PRN Poggi, Marshall Cork, MD      . metoCLOPramide (REGLAN) tablet 5-10 mg  5-10 mg Oral Q8H PRN Poggi, Marshall Cork, MD       Or  . metoCLOPramide (REGLAN) injection 5-10 mg  5-10 mg Intravenous Q8H PRN Poggi, Marshall Cork, MD      . ondansetron (ZOFRAN) tablet 4 mg  4 mg Oral Q6H PRN Poggi, Marshall Cork, MD       Or  . ondansetron (ZOFRAN) injection 4 mg  4 mg Intravenous Q6H PRN Poggi, Marshall Cork, MD      . oxyCODONE (Oxy IR/ROXICODONE) immediate release tablet 5-10 mg  5-10 mg Oral Q4H PRN Poggi, Marshall Cork, MD      . pantoprazole (PROTONIX) EC tablet 40 mg  40 mg Oral Daily Poggi, Marshall Cork, MD      . Derrill Memo ON 11/12/2017] rosuvastatin (CRESTOR) tablet 20 mg  20 mg Oral Daily Poggi, Marshall Cork, MD      . sodium phosphate (FLEET) 7-19 GM/118ML enema 1 enema  1 enema Rectal Once PRN Poggi, Marshall Cork, MD      . Derrill Memo ON 11/12/2017] tamsulosin (FLOMAX) capsule 0.4 mg  0.4 mg Oral Daily Poggi, Marshall Cork, MD      . traMADol Veatrice Bourbon) tablet 50 mg  50 mg Oral Q6H PRN Poggi, Marshall Cork, MD         Discharge Medications: Please see discharge summary for a list of discharge medications.  Relevant Imaging Results:  Relevant Lab Results:   Additional Information (SSN: [id number])  Jaz Laningham, Veronia Beets, LCSW

## 2017-11-11 NOTE — Op Note (Signed)
11/11/2017  12:48 PM  Patient:   Jared Freeman  Pre-Op Diagnosis:   Massive irreparable rotator cuff tear with cuff arthropathy, left shoulder.  Post-Op Diagnosis:   Same  Procedure:   Reverse left total shoulder arthroplasty.  Surgeon:   Pascal Lux, MD  Assistant:   Cameron Proud, PA-C  Anesthesia:   General endotracheal with an interscalene block using Exparel placed preoperatively by the anesthesiologist.  Findings:   As above.  Complications:   None  EBL:   100 cc  Fluids:   1200 cc crystalloid  UOP:   None  TT:   None  Drains:   None  Closure:   Staples  Implants:   All press-fit Bi[employer] Comprehensive system with a #14 micro-humeral stem, a 40 mm humeral tray with a +5 mm augment and a 3 mm insert, and a mini-base plate with a 40 mm glenosphere.  Brief Clinical Note:   The patient is a 77 year old male with a long history of gradually worsening left shoulder pain. Over the past month or so, his left shoulder symptoms have progressed significantly. His history and examination were consistent with a massive irreparable rotator cuff with significant cuff arthropathy. The patient presents at this time for a reverse left total shoulder arthroplasty.  Procedure:   The patient underwent placement of an interscalene block by the anesthesiologist using Exparel in the preoperative holding area before being brought into the operating room and lain in the supine position. The patient then underwent general endotracheal intubation and anesthesia before the patient was repositioned in the beach chair position using the beach chair positioner. The left shoulder and upper extremity were prepped with ChloraPrep solution before being draped sterilely. Preoperative antibiotics were administered. A standard anterior approach to the shoulder was made through an approximately 4-5 inch incision. The incision was carried down through the subcutaneous tissues to expose the deltopectoral  fascia. The interval between the deltoid and pectoralis muscles was identified and this plane developed, retracting the cephalic vein laterally with the deltoid muscle. The conjoined tendon was identified. Its lateral margin was dissected and the Kolbel self-retraining retractor inserted. The "three sisters" were identified and cauterized. Bursal tissues were removed to improve visualization. The subscapularis tendon was released from its attachment to the lesser tuberosity 1 cm proximal to its insertion and several tagging sutures placed. The inferior capsule was released with care after identifying and protecting the axillary nerve. The proximal humeral cut was made at approximately 25 of retroversion using the extra-medullary guide.   Attention was redirected to the glenoid. The labrum was debrided circumferentially before the center of the glenoid was marked with electrocautery. The guidewire was drilled into the glenoid neck using the appropriate guide. After verifying its position, it was overreamed with the mini-baseplate reamer to create a flat surface. The permanent mini-baseplate was impacted into place. It was stabilized with a 35 x 6.5 mm central screw and four peripheral screws. Locking screws were placed superiorly and inferiorly while nonlocking screws were placed anteriorly and posteriorly. The permanent 40 mm glenosphere was then impacted into place and its Morse taper locking mechanism verified using manual distraction.  Attention was directed to the humeral side. The humeral canal was reamed sequentially beginning with the end-cutting reamer then progressing from a 4 mm reamer up to a 14 mm reamer. This provided excellent circumferential chatter. The canal was broached beginning with a #11 broach and progressing to a #14 broach. This was left in place and a trial  reduction performed using the standard trial humeral platform. The arm demonstrated excellent range of motion as the hand could be  brought across the chest to the opposite shoulder and brought to the top of the patient's head and to the patient's ear. The shoulder appeared stable throughout this range of motion. The joint was dislocated and the trial components removed. The permanent #14 micro-stem was impacted into place with care taken to maintain the appropriate version.  Several additional trial reductions were performed using the +0 mm and +5 mm humeral trays as well as the +0 mm and +3 mm inserts.  The permanent 40 mm humeral platform with the +5 mm buildup and the +3 mm insert was put together on the back table and impacted into place. Again, the [facility] taper locking mechanism was verified using manual distraction. The shoulder was relocated using two finger pressure and again placed through a range of motion with the findings as described above.  The wound was copiously irrigated with bacitracin saline solution using the jet lavage system before a total of 30 cc of 0.5% Sensorcaine with epinephrine was injected into the pericapsular and peri-incisional tissues to help with postoperative analgesia. The subscapularis tendon was reapproximated using #2 FiberWire interrupted sutures. The deltopectoral interval was closed using #0 Vicryl interrupted sutures before the subcutaneous tissues were closed using 2-0 Vicryl interrupted sutures. The skin was closed using staples. Prior to closing the skin, 1 g of transexemic acid in 10 cc of normal saline was injected intra-articularly to help with postoperative bleeding. A sterile occlusive dressing was applied to the wound before the arm was placed into a shoulder immobilizer with an abduction pillow. A Polar Care system also was applied to the shoulder. The patient was then transferred back to a hospital bed before being awakened, extubated, and returned to the recovery room in satisfactory condition after tolerating the procedure well.

## 2017-11-11 NOTE — Anesthesia Procedure Notes (Signed)
Procedure Name: Intubation Date/Time: 11/11/2017 10:54 AM Performed by: Lesle Reek, CRNA Pre-anesthesia Checklist: Patient identified, Emergency Drugs available, Suction available, Timeout performed and Patient being monitored Patient Re-evaluated:Patient Re-evaluated prior to induction Oxygen Delivery Method: Circle system utilized Preoxygenation: Pre-oxygenation with 100% oxygen Induction Type: IV induction Laryngoscope Size: Mac and 3 Grade View: Grade I Tube type: Oral Tube size: 7.5 mm Number of attempts: 1 Airway Equipment and Method: Stylet Placement Confirmation: ETT inserted through vocal cords under direct vision,  positive ETCO2,  CO2 detector and breath sounds checked- equal and bilateral Secured at: 22 cm Tube secured with: Tape

## 2017-11-11 NOTE — Anesthesia Procedure Notes (Signed)
Anesthesia Regional Block: Interscalene brachial plexus block   Pre-Anesthetic Checklist: ,, timeout performed, Correct Patient, Correct Site, Correct Laterality, Correct Procedure, Correct Position, site marked, Risks and benefits discussed,  Surgical consent,  Pre-op evaluation,  At surgeon's request and post-op pain management  Laterality: Left and Upper  Prep: chloraprep       Needles:  Injection technique: Single-shot  Needle Type: Stimiplex     Needle Length: 5cm  Needle Gauge: 22     Additional Needles:   Procedures:,,,, ultrasound used (permanent image in chart),,,,  Narrative:  Start time: 11/11/2017 9:41 AM End time: 11/11/2017 9:45 AM Injection made incrementally with aspirations every 5 mL.  Performed by: Personally   Additional Notes: Functioning IV was confirmed and monitors were applied.  A 53mm 22ga Stimuplex needle was used. Sterile prep and drape,hand hygiene and sterile gloves were used.  Negative aspiration and negative test dose prior to incremental administration of local anesthetic. The patient tolerated the procedure well.

## 2017-11-11 NOTE — Evaluation (Signed)
Occupational Therapy Evaluation Patient Details Name: Jared Freeman MRN: [id number] DOB: 31-Oct-1940 Today's Date: 11/11/2017    History of Present Illness Pt is a 77 y.o. 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  PMH includes s/p B trigger finger release, penile CA 2014, htn, CAD, h/o MI, sleep apnea, anxiety, DM.   Clinical Impression   Patient was seen for an OT evaluation this date. Pt lives in a 1 story home with his spouse and will have 24/7 assist from family as needed following this hospitalization while recovering. Prior to surgery, pt was active and independent. Pt has orders for LUE to be immobilized and will be NWBing per MD. Patient presents with impaired strength/ROM and sensation to LUE with block not completely resolved yet and a decreased knowledge of his LUE precautions and how to implement during ADL and mobility tasks. These impairments result in a decreased ability to perform self care tasks requiring PRN min assist for UB/LB dressing and bathing and max assist for application of polar care, compression stockings, and sling/immobilizer from spouse. Spouse and adult son present for session. Pt/family instructed in polar care mgt, compression stockings mgt, sling/immobilizer mgt, ROM exercises for LUE (with instructions for no shoulder exercises until full sensation has returned), LUE precautions, adaptive strategies for bathing/dressing/toileting/grooming, positioning and considerations for sleep, and home/routines modifications to maximize falls prevention, safety, and independence. Handout provided. OT demonstrated sling/immobilizer and polar care mgt including doffing and donning for educational purposes and to improve comfort, optimize positioning, and to maximize skin integrity/safety. Pt/family verbalized understanding of all education/training provided. Pt will benefit from skilled OT  services while in the hospital to address these limitations and improve independence in daily tasks. Recommend follow up therapy as arranged by the surgeon at discharge.    Follow Up Recommendations  Follow surgeon's recommendation for DC plan and follow-up therapies    Equipment Recommendations  None recommended by OT    Recommendations for Other Services       Precautions / Restrictions Precautions Precautions: Fall;Shoulder Shoulder Interventions: Shoulder sling/immobilizer;Shoulder abduction pillow;At all times;Off for dressing/bathing/exercises Precaution Booklet Issued: Yes (comment) Required Braces or Orthoses: Other Brace/Splint Other Brace/Splint: shoulder immobilizer with abduction pillow Restrictions Weight Bearing Restrictions: Yes LUE Weight Bearing: Non weight bearing      Mobility Bed Mobility             General bed mobility comments: deferred, up in recliner  Transfers Overall transfer level: Needs assistance Equipment used: None Transfers: Sit to/from [facility] Sit to Stand: Supervision Stand pivot transfers: Supervision       General transfer comment: with PT, steady safe transfers; SBA for lines    Balance Overall balance assessment: Needs assistance Sitting-balance support: No upper extremity supported;Feet supported Sitting balance-Leahy Scale: Normal Sitting balance - Comments: steady sitting reaching outside BOS with R UE   Standing balance support: No upper extremity supported;During functional activity Standing balance-Leahy Scale: Good                             ADL either performed or assessed with clinical judgement   ADL Overall ADL's : Needs assistance/impaired     Grooming: Sitting;Modified independent;With caregiver independent assisting Grooming Details (indicate cue type and reason): pt/family instructed in modified techniques for LUE underarm grooming Upper Body Bathing: Minimal  assistance;With caregiver independent assisting Upper Body Bathing Details (indicate cue type and  reason): pt/family instructed in modified techniques for LUE underarm bathing; PRN min assist Lower Body Bathing: Sit to/from stand;Minimal assistance;With caregiver independent assisting Lower Body Bathing Details (indicate cue type and reason): pt/family instructed in positioning of LUE during bathing, encouraged family to be with pt for initial shower once cleared by MD Upper Body Dressing : Minimal assistance;Moderate assistance;Sitting;Adhering to UE precautions;With caregiver independent assisting Upper Body Dressing Details (indicate cue type and reason): pt/family instructed in hemi techniques for UB dressing Lower Body Dressing: Sit to/from stand;Minimal assistance;With caregiver independent assisting Lower Body Dressing Details (indicate cue type and reason): PRN min assist                     Vision Baseline Vision/History: Wears glasses Wears Glasses: At all times Patient Visual Report: No change from baseline       Perception     Praxis      Pertinent Vitals/Pain Pain Assessment: No/denies pain     Hand Dominance Right   Extremity/Trunk Assessment Upper Extremity Assessment Upper Extremity Assessment: LUE deficits/detail(RUE WFL) LUE Deficits / Details: impaired sensation and tingling; grip WFL, unable to perform AROM elbow yet 2/2 block not fully resolved LUE: Unable to fully assess due to immobilization LUE Coordination: decreased gross motor;decreased fine motor   Lower Extremity Assessment Lower Extremity Assessment: Overall WFL for tasks assessed   Cervical / Trunk Assessment Cervical / Trunk Assessment: Normal   Communication Communication Communication: No difficulties   Cognition Arousal/Alertness: Awake/alert Behavior During Therapy: WFL for tasks assessed/performed Overall Cognitive Status: Within Functional Limits for tasks assessed                                      General Comments  LUE sling and polar care adjusted to maximize comfort, positioning, and skin/joint protection/integrity    Exercises Other Exercises Other Exercises: pt/family instructed in positioning of LUE for sleep, precautions, falls prevention Other Exercises: pt/family instructed in polar care mgt and sling/immobilizer mgt   Shoulder Instructions      Home Living Family/patient expects to be discharged to:: Private residence Living Arrangements: Spouse/significant other Available Help at Discharge: Family;Available 24 hours/day Type of Home: House Home Access: Stairs to enter [employer] of Steps: 2 Entrance Stairs-Rails: Right Home Layout: One level     Bathroom Shower/Tub: Teacher, early years/pre: Handicapped height     Home Equipment: Grab bars - tub/shower          Prior Functioning/Environment Level of Independence: Independent        Comments: Working full time; very active; 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; mows lawns        OT Problem List: Impaired UE functional use;Impaired sensation;Decreased range of motion;Decreased strength;Decreased knowledge of use of DME or AE;Decreased knowledge of precautions      OT Treatment/Interventions: Self-care/ADL training;Therapeutic exercise;Therapeutic activities;DME and/or AE instruction;Patient/family education    OT Goals(Current goals can be found in the care plan section) Acute Rehab OT Goals Patient Stated Goal: to go home and return to PLOF with improved LUE function OT Goal Formulation: With patient/family Time For Goal Achievement: 11/25/17 Potential to Achieve Goals: Good ADL Goals Pt Will Perform Upper Body Dressing: with min assist;with caregiver independent in assisting;sitting Additional ADL Goal #1: Pt will independently instruct family in compression stocking mgt, including wear schedule, donning/doffing, and  positioning. Additional ADL  Goal #2: Pt will independently instruct family in polar care mgt, including wear schedule, donning/doffing, and positioning. Additional ADL Goal #3: Pt will independently instruct family in sling/immobilizer mgt, including wear schedule, donning/doffing, and positioning.  OT Frequency: Min 1X/week   Barriers to D/C:            Co-evaluation              AM-PAC PT "6 Clicks" Daily Activity     Outcome Measure Help from another person eating meals?: None Help from another person taking care of personal grooming?: None Help from another person toileting, which includes using toliet, bedpan, or urinal?: A Little Help from another person bathing (including washing, rinsing, drying)?: A Little Help from another person to put on and taking off regular upper body clothing?: A Little Help from another person to put on and taking off regular lower body clothing?: A Little 6 Click Score: 20   End of Session    Activity Tolerance: Patient tolerated treatment well Patient left: in chair;with call bell/phone within reach;with chair alarm set;with family/visitor present;with SCD's reapplied;Other (comment)(polar care and sling in place)  OT Visit Diagnosis: Other abnormalities of gait and mobility (R26.89)                Time: 0300-9233 OT Time Calculation (min): 29 min Charges:  OT General Charges $OT Visit: 1 Visit OT Evaluation $OT Eval Low Complexity: 1 Low OT Treatments $Self Care/Home Management : 8-22 mins  Jeni Salles, MPH, MS, OTR/L ascom [phone] 11/11/17, 5:05 PM

## 2017-11-11 NOTE — Evaluation (Signed)
Physical Therapy Evaluation Patient Details Name: Jared Freeman MRN: [id number] DOB: 05-14-1940 Today's Date: 11/11/2017   History of Present Illness  Pt is a 77 y.o. 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  PMH includes s/p B trigger finger release, penile CA 2014, htn, CAD, h/o MI, sleep apnea, anxiety, DM.  Clinical Impression  Prior to hospital admission, pt was independent and active.  Pt lives with his wife in 1 level home with steps to enter.  Currently pt is modified independent semi-supine to sit; SBA with transfers; and CGA to SBA with ambulation around nursing loop no AD.  No c/o pain during session.  O2 sats 89-90% post ambulation on room air but recovered to 93% quickly with sitting rest break--pt reports recent respiratory illness (nurse notified).  Pt would benefit from skilled PT to address noted impairments and functional limitations (see below for any additional details).  Upon hospital discharge, recommend pt discharge with therapy per surgeon's protocol.    Follow Up Recommendations Follow surgeon's recommendation for DC plan and follow-up therapies    Equipment Recommendations  None recommended by PT    Recommendations for Other Services OT consult     Precautions / Restrictions Precautions Precautions: Fall Required Braces or Orthoses: Other Brace/Splint Other Brace/Splint: shoulder immobilizer with abduction pillow Restrictions Weight Bearing Restrictions: Yes LUE Weight Bearing: Non weight bearing      Mobility  Bed Mobility Overal bed mobility: Modified Independent             General bed mobility comments: Semi supine to sitting with mild increased effort  Transfers Overall transfer level: Needs assistance Equipment used: None Transfers: Sit to/from [facility] Sit to Stand: Supervision Stand pivot transfers: Supervision       General  transfer comment: steady safe transfers; SBA for lines  Ambulation/Gait Ambulation/Gait assistance: Min guard;Supervision Gait Distance (Feet): 200 Feet Assistive device: None Gait Pattern/deviations: Step-through pattern     General Gait Details: mild increased B lateral sway but no loss of balance  Stairs            Wheelchair Mobility    Modified Rankin (Stroke Patients Only)       Balance Overall balance assessment: Needs assistance Sitting-balance support: No upper extremity supported;Feet supported Sitting balance-Leahy Scale: Normal Sitting balance - Comments: steady sitting reaching outside BOS with R UE   Standing balance support: No upper extremity supported;During functional activity Standing balance-Leahy Scale: Good                               Pertinent Vitals/Pain Pain Assessment: No/denies pain  HR WFL during session.    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; mows lawns     Hand Dominance        Extremity/Trunk Assessment   Upper Extremity Assessment Upper Extremity Assessment: Defer to OT evaluation(L UE in sling; R UE WFL AROM)    Lower Extremity Assessment Lower Extremity Assessment: Overall WFL for  tasks assessed    Cervical / Trunk Assessment Cervical / Trunk Assessment: Normal  Communication   Communication: No difficulties  Cognition Arousal/Alertness: Awake/alert Behavior During Therapy: WFL for tasks assessed/performed Overall Cognitive Status: Within Functional Limits for tasks assessed                                         General Comments General comments (skin integrity, edema, etc.): L UE in shoulder immobilizer with abduction pillow and polar care in place.  Nursing cleared pt for participation in physical therapy.  Pt agreeable to PT session.    Exercises  Polar care use education; safety with functional mobility   Assessment/Plan    PT Assessment Patient needs continued PT services  PT Problem List Decreased strength;Decreased range of motion;Decreased balance;Decreased mobility;Decreased knowledge of use of DME;Decreased knowledge of precautions;Pain;Decreased skin integrity       PT Treatment Interventions DME instruction;Gait training;Stair training;Functional mobility training;Therapeutic activities;Therapeutic exercise;Balance training;Patient/family education    PT Goals (Current goals can be found in the Care Plan section)  Acute Rehab PT Goals Patient Stated Goal: to go home PT Goal Formulation: With patient Time For Goal Achievement: 11/25/17 Potential to Achieve Goals: Good    Frequency BID   Barriers to discharge        Co-evaluation               AM-PAC PT "6 Clicks" Daily Activity  Outcome Measure Difficulty turning over in bed (including adjusting bedclothes, sheets and blankets)?: A Little Difficulty moving from lying on back to sitting on the side of the bed? : A Lot Difficulty sitting down on and standing up from a chair with arms (e.g., wheelchair, bedside commode, etc,.)?: Unable Help needed moving to and from a bed to chair (including a wheelchair)?: A Little Help needed walking in hospital room?: A Little Help needed climbing 3-5 steps with a railing? : A Little 6 Click Score: 15    End of Session Equipment Utilized During Treatment: Gait belt;Other (comment)(L UE shoulder immobilizer with abduction pillow) Activity Tolerance: Patient tolerated treatment well Patient left: in chair;with call bell/phone within  reach;with chair alarm set;with family/visitor present;with SCD's reapplied;Other (comment)(B heels elevated via pillow; polar care in place and activated) Nurse Communication: Mobility status;Precautions;Weight bearing status PT Visit Diagnosis: Unsteadiness on feet (R26.81);Pain Pain - Right/Left: Left Pain - part of body: Shoulder    Time: 1510-1545 PT Time Calculation (min) (ACUTE ONLY): 35 min   Charges:   PT Evaluation $PT Eval Low Complexity: 1 Low PT Treatments $Therapeutic Activity: 8-22 mins       Leitha Bleak, PT 11/11/17, 4:03 PM [phone]

## 2017-11-12 LAB — CBC WITH DIFFERENTIAL/PLATELET
BASOS ABS: 0 10*3/uL (ref 0–0.1)
BASOS PCT: 0 %
EOS PCT: 2 %
Eosinophils Absolute: 0.2 10*3/uL (ref 0–0.7)
HEMATOCRIT: 41.5 % (ref 40.0–52.0)
Hemoglobin: 14.1 g/dL (ref 13.0–18.0)
Lymphocytes Relative: 15 %
Lymphs Abs: 1.2 10*3/uL (ref 1.0–3.6)
MCH: 31 pg (ref 26.0–34.0)
MCHC: 34 g/dL (ref 32.0–36.0)
MCV: 91.1 fL (ref 80.0–100.0)
MONO ABS: 0.9 10*3/uL (ref 0.2–1.0)
MONOS PCT: 11 %
NEUTROS ABS: 5.9 10*3/uL (ref 1.4–6.5)
Neutrophils Relative %: 72 %
PLATELETS: 195 10*3/uL (ref 150–440)
RBC: 4.55 MIL/uL (ref 4.40–5.90)
RDW: 14.1 % (ref 11.5–14.5)
WBC: 8.3 10*3/uL (ref 3.8–10.6)

## 2017-11-12 LAB — BASIC METABOLIC PANEL
ANION GAP: 7 (ref 5–15)
BUN: 29 mg/dL — ABNORMAL HIGH (ref 8–23)
CALCIUM: 8 mg/dL — AB (ref 8.9–10.3)
CO2: 27 mmol/L (ref 22–32)
CREATININE: 1.65 mg/dL — AB (ref 0.61–1.24)
Chloride: 110 mmol/L (ref 98–111)
GFR, EST AFRICAN AMERICAN: 45 mL/min — AB (ref >60)
GFR, EST NON AFRICAN AMERICAN: 39 mL/min — AB (ref >60)
GLUCOSE: 131 mg/dL — AB (ref 70–99)
Potassium: 4.4 mmol/L (ref 3.5–5.1)
Sodium: 144 mmol/L (ref 135–145)

## 2017-11-12 MED ORDER — TRAMADOL HCL 50 MG PO TABS: 50.0000 mg | ORAL_TABLET | Freq: Four times a day (QID) | ORAL | 0 refills | Status: DC | PRN

## 2017-11-12 MED ORDER — OXYCODONE HCL 5 MG PO TABS: 5.0000 mg | ORAL_TABLET | ORAL | 0 refills | Status: DC | PRN

## 2017-11-12 NOTE — Progress Notes (Signed)
Physical Therapy Treatment Patient Details Name: Jared Freeman MRN: [id number] DOB: February 09, 1941 Today's Date: 11/12/2017    History of Present Illness Pt is a 77 y.o. 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  PMH includes s/p B trigger finger release, penile CA 2014, htn, CAD, h/o MI, sleep apnea, anxiety, DM.    PT Comments    Pt modified independent with bed mobility, independent with transfers, SBA with ambulation around nursing loop, and SBA navigating 4 stairs with railing.  No loss of balance during session.  Pt able to verbalize L UE precautions.  Pain 4/10 L shoulder during session.  Some SOB noted with ambulation/activities but O2 sats 96% or greater on room air during session.  Pt appears safe to discharge home when medically appropriate (nurse notified).   Follow Up Recommendations  Follow surgeon's recommendation for DC plan and follow-up therapies     Equipment Recommendations  None recommended by PT    Recommendations for Other Services OT consult     Precautions / Restrictions Precautions Precautions: Fall;Shoulder Shoulder Interventions: Shoulder sling/immobilizer;Shoulder abduction pillow;At all times;Off for dressing/bathing/exercises Precaution Booklet Issued: Yes (comment) Required Braces or Orthoses: Other Brace/Splint Other Brace/Splint: shoulder immobilizer with abduction pillow Restrictions Weight Bearing Restrictions: Yes LUE Weight Bearing: Non weight bearing    Mobility  Bed Mobility Overal bed mobility: Modified Independent             General bed mobility comments: Supine to/from sit via logrolling to R side (initial vc's for technique and then pt able to perform on own)  Transfers Overall transfer level: Independent Equipment used: None Transfers: Sit to/from [employer] to Stand: Independent Stand pivot transfers: Independent        General transfer comment: steady safe transfers  Ambulation/Gait Ambulation/Gait assistance: Supervision Gait Distance (Feet): (180 feet; 160 feet) Assistive device: None Gait Pattern/deviations: Step-through pattern   Gait velocity interpretation: >2.62 ft/sec, indicative of community ambulatory General Gait Details: initial increased B lateral sway but improved with distance ambulated   Stairs Stairs: Yes Stairs assistance: Supervision Stair Management: One rail Right Number of Stairs: 4 General stair comments: forward ascending; side stepping descending; steady   Wheelchair Mobility    Modified Rankin (Stroke Patients Only)       Balance Overall balance assessment: Needs assistance Sitting-balance support: No upper extremity supported;Feet supported Sitting balance-Leahy Scale: Normal Sitting balance - Comments: steady sitting reaching outside BOS with R UE   Standing balance support: No upper extremity supported;During functional activity Standing balance-Leahy Scale: Good Standing balance comment: no loss of balance with ambulation                            Cognition Arousal/Alertness: Awake/alert Behavior During Therapy: WFL for tasks assessed/performed Overall Cognitive Status: Within Functional Limits for tasks assessed                                        Exercises      General Comments General comments (skin integrity, edema, etc.): L UE sling and polar care in place Pt agreeable to PT session.     Pertinent Vitals/Pain Pain Assessment: 0-10 Pain Score: 4  Pain Location: L shoulder Pain Descriptors / Indicators: Sore Pain Intervention(s): Limited activity within patient's tolerance;Monitored during session;Premedicated before session;Repositioned  Vitals (  HR and O2 on room air) stable and WFL throughout treatment session.    Home Living                      Prior Function            PT Goals  (current goals can now be found in the care plan section) Acute Rehab PT Goals Patient Stated Goal: to go home and return to PLOF with improved LUE function PT Goal Formulation: With patient Time For Goal Achievement: 11/25/17 Potential to Achieve Goals: Good Progress towards PT goals: Progressing toward goals    Frequency    BID      PT Plan Current plan remains appropriate    Co-evaluation              AM-PAC PT "6 Clicks" Daily Activity  Outcome Measure  Difficulty turning over in bed (including adjusting bedclothes, sheets and blankets)?: None Difficulty moving from lying on back to sitting on the side of the bed? : A Little Difficulty sitting down on and standing up from a chair with arms (e.g., wheelchair, bedside commode, etc,.)?: None Help needed moving to and from a bed to chair (including a wheelchair)?: None Help needed walking in hospital room?: None Help needed climbing 3-5 steps with a railing? : A Little 6 Click Score: 22    End of Session Equipment Utilized During Treatment: Gait belt;Other (comment)(L shoulder immobilizer with abduction pillow) Activity Tolerance: Patient tolerated treatment well Patient left: in chair;with call bell/phone within reach;with chair alarm set;with family/visitor present;with SCD's reapplied;Other (comment)(B heels elevated via pillow; polar care in place and activated) Nurse Communication: Mobility status;Precautions;Weight bearing status PT Visit Diagnosis: Unsteadiness on feet (R26.81);Pain Pain - Right/Left: Left Pain - part of body: Shoulder     Time: 7903-8333 PT Time Calculation (min) (ACUTE ONLY): 23 min  Charges:  $Gait Training: 8-22 mins $Therapeutic Activity: 8-22 mins                    Leitha Bleak, PT 11/12/17, 11:33 AM [phone]

## 2017-11-12 NOTE — Discharge Instructions (Signed)
Diet: As you were doing prior to hospitalization   Shower:  May shower but keep the wounds dry, use an occlusive plastic wrap, NO SOAKING IN TUB.  If the bandage gets wet, change with a clean dry gauze.  Dressing:  You may change your dressing as needed. Change the dressing with sterile gauze dressing.    Activity:  Increase activity slowly as tolerated, but follow the weight bearing instructions below.  No lifting or driving for 6 weeks.  Weight Bearing:   Non-weightbearing to the left arm.  Blood Clot Prevention: Take 1 325mg  aspirin daily upon discharge.  To prevent constipation: you may use a stool softener such as -  Colace (over the counter) 100 mg by mouth twice a day  Drink plenty of fluids (prune juice may be helpful) and high fiber foods Miralax (over the counter) for constipation as needed.    Itching:  If you experience itching with your medications, try taking only a single pain pill, or even half a pain pill at a time.  You may take up to 10 pain pills per day, and you can also use benadryl over the counter for itching or also to help with sleep.   Precautions:  If you experience chest pain or shortness of breath - call 911 immediately for transfer to the hospital emergency department!!  If you develop a fever greater that 101 F, purulent drainage from wound, increased redness or drainage from wound, or calf pain-Call [employer]                                              Follow- Up Appointment:  Please call for an appointment to be seen in 2 weeks at [facility]

## 2017-11-12 NOTE — Progress Notes (Signed)
Discharge instructions and prescriptions given to pt. IV removed. Pt dressed and ready to discharge home with wife.

## 2017-11-12 NOTE — Progress Notes (Signed)
Clinical Social Worker (CSW) received SNF consult. PT is recommending follow surgeon's recommendation for D/C plan. RN case manager aware of above. Please reconsult if future social work needs arise. CSW signing off.   Basilio Meadow, LCSW [phone]  

## 2017-11-12 NOTE — Discharge Summary (Signed)
Physician Discharge Summary  Patient ID: Jared Freeman MRN: [id number] DOB/AGE: 03/03/1940 77 y.o.  Admit date: 11/11/2017 Discharge date: 11/12/2017  Admission Diagnoses:  rotator cuff arthroplasty of left shoulder, rotator cuff tendinitis  Discharge Diagnoses: Patient Active Problem List   Diagnosis Date Noted  . Status post reverse arthroplasty of shoulder, left 11/11/2017    Past Medical History:  Diagnosis Date  . Anemia   . Anxiety   . Arthritis   . Asthma   . Coronary artery disease   . Gout   . History of colon polyps   . Hyperlipidemia   . Hypertension   . Myocardial infarction ([place])   . Nasal septal defect   . Penile cancer ([place]) 2014  . Polyarthropathy   . Pre-diabetes   . S/P trigger finger release    bilateral  . Sleep apnea   . Urinary outflow obstruction    Transfusion: None.   Consultants (if any):   Discharged Condition: Improved  Hospital Course: Jared Freeman is an 77 y.o. male who was admitted 11/11/2017 with a diagnosis of a massive irreparable rotator cuff tear with cuff arthropathy of the left shoulder and went to the operating room on 11/11/2017 and underwent the above named procedures.    Surgeries: Procedure(s): REVERSE SHOULDER ARTHROPLASTY on 11/11/2017 Patient tolerated the surgery well. Taken to PACU where she was stabilized and then transferred to the orthopedic floor.  Started on Lovenox 40mg  q 24 hrs. Foot pumps applied bilaterally at 80 mm. Heels elevated on bed with rolled towels. No evidence of DVT. Negative [place]. Physical therapy started on day #1 for gait training and transfer. OT started day #1 for ADL and assisted devices.  Patient's IV was removed on POD1.  Implants: All press-fit [employer] Comprehensive system with a #14 micro-humeral stem, a 40 mm humeral tray with a +5 mm augment and a 3 mm insert, and a mini-base plate with a 40 mm glenosphere.  He was given perioperative antibiotics:  Anti-infectives (From  admission, onward)   Start     Dose/Rate Route Frequency Ordered Stop   11/11/17 1700  ceFAZolin (ANCEF) IVPB 2g/100 mL premix     2 g 200 mL/hr over 30 Minutes Intravenous Every 6 hours 11/11/17 1406 11/12/17 0609   11/11/17 1415  hydroxychloroquine (PLAQUENIL) tablet 100 mg     100 mg Oral Daily 11/11/17 1406     11/11/17 0802  ceFAZolin (ANCEF) 2-4 GM/100ML-% IVPB    Note to Pharmacy:  Lyman Bishop   : cabinet override      11/11/17 0802 11/11/17 1105   11/11/17 0045  ceFAZolin (ANCEF) IVPB 2g/100 mL premix     2 g 200 mL/hr over 30 Minutes Intravenous  Once 11/11/17 0031 11/11/17 1105    .  He was given sequential compression devices, early ambulation, and Lovenox for DVT prophylaxis.  He benefited maximally from the hospital stay and there were no complications.    Recent vital signs:  Vitals:   11/11/17 2356 11/12/17 0413  BP: 135/83 140/82  Pulse: 77 69  Resp: 18   Temp: 98.4 F (36.9 C) 98.7 F (37.1 C)  SpO2: 96% 95%    Recent laboratory studies:  Lab Results  Component Value Date   HGB 14.1 11/12/2017   HGB 14.7 11/06/2017   HGB 13.6 05/15/2011   Lab Results  Component Value Date   WBC 8.3 11/12/2017   PLT 195 11/12/2017   Lab Results  Component Value Date   INR  1.0 05/13/2011   Lab Results  Component Value Date   NA 144 11/12/2017   K 4.4 11/12/2017   CL 110 11/12/2017   CO2 27 11/12/2017   BUN 29 (H) 11/12/2017   CREATININE 1.65 (H) 11/12/2017   GLUCOSE 131 (H) 11/12/2017    Discharge Medications:   Allergies as of 11/12/2017   No Known Allergies     Medication List    TAKE these medications   acetaminophen 500 MG tablet Commonly known as:  TYLENOL Take 875 mg by mouth every 6 (six) hours as needed.   allopurinol 300 MG tablet Commonly known as:  ZYLOPRIM Take 300 mg by mouth daily.   amLODipine-benazepril 5-20 MG capsule Commonly known as:  LOTREL Take 1 capsule by mouth daily.   aspirin 325 MG tablet Take 325 mg by mouth  daily.   escitalopram 10 MG tablet Commonly known as:  LEXAPRO Take 10 mg by mouth daily.   hydroxychloroquine 200 MG tablet Commonly known as:  PLAQUENIL Take 100 mg by mouth daily.   leflunomide 10 MG tablet Commonly known as:  ARAVA Take 10 mg by mouth daily.   oxyCODONE 5 MG immediate release tablet Commonly known as:  Oxy IR/ROXICODONE Take 1-2 tablets (5-10 mg total) by mouth every 4 (four) hours as needed for severe pain. What changed:    how much to take  when to take this  reasons to take this   rosuvastatin 20 MG tablet Commonly known as:  CRESTOR Take 20 mg by mouth daily.   sildenafil 20 MG tablet Commonly known as:  REVATIO Take 1 tablet (20 mg total) by mouth as needed. Take 1-5 tabs as needed prior to intercourse What changed:    how much to take  reasons to take this  additional instructions   tamsulosin 0.4 MG Caps capsule Commonly known as:  FLOMAX Take 0.4 mg by mouth daily.   traMADol 50 MG tablet Commonly known as:  ULTRAM Take 1 tablet (50 mg total) by mouth every 6 (six) hours as needed for moderate pain. What changed:    when to take this  reasons to take this       Diagnostic Studies: Ct Shoulder Left Wo Contrast  Result Date: 10/29/2017 CLINICAL DATA:  Preop for shoulder surgery. EXAM: CT OF THE UPPER LEFT EXTREMITY WITHOUT CONTRAST TECHNIQUE: Multidetector CT imaging of the upper left extremity was performed according to the standard protocol. COMPARISON:  Radiographs 10/23/2017 FINDINGS: Advanced glenohumeral joint degenerative changes are noted with joint space narrowing and significant spurring. There are also subchondral cystic changes involving the glenoid which is slightly widened and thinned. There are moderate AC joint degenerative changes along with moderate inferior spurring from the acromion. There are multiple ossified loose bodies in the subacromial/subdeltoid space and subcoracoid bursa and posterior joint space. There  is also a large glenohumeral joint effusion and enhancing synovitis. CT findings are consistent with a full-thickness retracted rotator cuff tear with marked narrowing of the humeroacromial space. The visualized left lung is grossly clear. No worrisome pulmonary lesions. Extensive coronary artery calcifications are noted incidentally. IMPRESSION: 1. Advanced glenohumeral joint degenerative changes as detailed above. There is also a large joint effusion and enhancing synovitis along with numerous loose bodies. Synovial osteochondromatosis would be a possibility. 2. Large full-thickness retracted rotator cuff tear. Electronically Signed   By: Marijo Sanes M.D.   On: 10/29/2017 15:09   Dg Shoulder Left  Result Date: 10/23/2017 CLINICAL DATA:  Shoulder pain EXAM: LEFT  SHOULDER - 2+ VIEW COMPARISON:  None FINDINGS: No fracture or malalignment. Mild AC joint degenerative change. Marked narrowing of subacromial space, consistent with rotator cuff disease. Calcific tendinitis versus small loose bodies superior to the lateral humeral head. Subchondral sclerosis and small cysts. Moderate glenohumeral degenerative change. IMPRESSION: 1. Moderate arthritis of the left shoulder without acute osseous abnormality 2. Marked narrowing of the subacromial space consistent with rotator cuff disease. Electronically Signed   By: Donavan Foil M.D.   On: 10/23/2017 16:34   Dg Shoulder Left Port  Result Date: 11/11/2017 CLINICAL DATA:  Left shoulder replacement EXAM: LEFT SHOULDER - 1 VIEW COMPARISON:  CT left shoulder of 10/29/2016 and left shoulder films of 10/23/2016 FINDINGS: The patient has undergone reverse shoulder replacement. The prosthetic components appear to be in good position with no complicating features noted. IMPRESSION: Reverse left shoulder replacement components in good position with no complicating features. Electronically Signed   By: Ivar Drape M.D.   On: 11/11/2017 13:51   Disposition: Plan will be for  discharge home today following morning session of PT.  Follow-up Information    Lattie Corns, PA-C Follow up in 14 day(s).   Specialty:  Physician Assistant Why:  Electa Sniff information: [place] [address] [phone]          Signed: Judson Roch PA-C 11/12/2017, 7:43 AM

## 2017-11-12 NOTE — Care Management Note (Signed)
Case Management Note  Patient Details  Name: Jared Freeman MRN: [id number] Date of Birth: 12/29/1940  Subjective/Objective:  Met with patient and his wife at bedside to discuss transitions of care. He will be discharging today to home with his wife as caregiver. He has no DME needs. Provided a list of home health providers. Referral to [facility] for HHPT/HHOT. He will discharge on ASA                   Action/Plan: [facility] for PT/OT.   Expected Discharge Date:  11/12/17               Expected Discharge Plan:  [place]  In-House Referral:     Discharge planning Services  CM Consult  Post Acute Care Choice:  Home Health Choice offered to:  Patient, Spouse  DME Arranged:    DME Agency:     HH Arranged:  PT, OT HH Agency:  [facility] (formerly [facility])  Status of Service:  Completed, signed off  If discussed at [employer], dates discussed:    Additional Comments:  Jolly Mango, RN 11/12/2017, 9:49 AM

## 2017-11-12 NOTE — Progress Notes (Signed)
  Subjective: 1 Day Post-Op Procedure(s) (LRB): REVERSE SHOULDER ARTHROPLASTY (Left) Patient reports pain as mild.   Patient is well, and has had no acute complaints or problems Plan is to go Home after hospital stay. Negative for chest pain and shortness of breath Fever: no Gastrointestinal:Negative for nausea and vomiting  Objective: Vital signs in last 24 hours: Temp:  [96.7 F (35.9 C)-98.7 F (37.1 C)] 98.7 F (37.1 C) (10/02 0413) Pulse Rate:  [54-106] 69 (10/02 0413) Resp:  [13-22] 18 (10/01 2356) BP: (93-156)/(59-113) 140/82 (10/02 0413) SpO2:  [91 %-99 %] 95 % (10/02 0413) Weight:  [87.1 kg] 87.1 kg (10/01 0823)  Intake/Output from previous day:  Intake/Output Summary (Last 24 hours) at 11/12/2017 0737 Last data filed at 11/12/2017 0609 Gross per 24 hour  Intake 1265.76 ml  Output 1550 ml  Net -284.24 ml    Intake/Output this shift: No intake/output data recorded.  Labs: Recent Labs    11/12/17 0417  HGB 14.1   Recent Labs    11/12/17 0417  WBC 8.3  RBC 4.55  HCT 41.5  PLT 195   Recent Labs    11/12/17 0417  NA 144  K 4.4  CL 110  CO2 27  BUN 29*  CREATININE 1.65*  GLUCOSE 131*  CALCIUM 8.0*   No results for input(s): LABPT, INR in the last 72 hours.   EXAM General - Patient is Alert, Appropriate and Oriented Extremity - ABD soft Neurovascular intact Intact pulses distally Dorsiflexion/Plantar flexion intact Incision: dressing C/D/I No cellulitis present  Intact to light touch to the left arm this morning. Dressing/Incision - clean, dry, no drainage Motor Function - intact, moving foot and toes well on exam.  Abdomen soft with normal BS.  Past Medical History:  Diagnosis Date  . Anemia   . Anxiety   . Arthritis   . Asthma   . Coronary artery disease   . Gout   . History of colon polyps   . Hyperlipidemia   . Hypertension   . Myocardial infarction ([place])   . Nasal septal defect   . Penile cancer ([place]) 2014  .  Polyarthropathy   . Pre-diabetes   . S/P trigger finger release    bilateral  . Sleep apnea   . Urinary outflow obstruction     Assessment/Plan: 1 Day Post-Op Procedure(s) (LRB): REVERSE SHOULDER ARTHROPLASTY (Left) Active Problems:   Status post reverse arthroplasty of shoulder, left  Estimated body mass index is 31.95 kg/m as calculated from the following:   Height as of this encounter: 5\' 5"  (1.651 m).   Weight as of this encounter: 87.1 kg. Advance diet Up with therapy this AM.  Labs reviewed this morning. BUN and Cr elevated, but improved from baseline labs in September. Up with therapy this AM. Will discharge home on home dose of 325mg  aspirin daily. Plan will be for discharge home this afternoon.  DVT Prophylaxis - Lovenox, Foot Pumps and TED hose Non-weightbearing to the left arm.  Raquel Zeeva Courser, PA-C [facility] Orthopaedic Surgery 11/12/2017, 7:37 AM

## 2017-11-13 ENCOUNTER — Encounter: Payer: Self-pay | Admitting: Surgery

## 2017-11-14 NOTE — Anesthesia Postprocedure Evaluation (Signed)
Anesthesia Post Note  Patient: Jared Freeman  Procedure(s) Performed: REVERSE SHOULDER ARTHROPLASTY (Left Shoulder)  Patient location during evaluation: PACU Anesthesia Type: General Level of consciousness: awake and alert Pain management: pain level controlled Vital Signs Assessment: post-procedure vital signs reviewed and stable Respiratory status: spontaneous breathing, nonlabored ventilation, respiratory function stable and patient connected to nasal cannula oxygen Cardiovascular status: blood pressure returned to baseline and stable Postop Assessment: no apparent nausea or vomiting Anesthetic complications: no     Last Vitals:  Vitals:   11/12/17 0758 11/12/17 0817  BP: (!) 144/82 137/88  Pulse: 72 72  Resp:  18  Temp: 36.7 C 36.9 C  SpO2: 94% 96%    Last Pain:  Vitals:   11/12/17 0817  TempSrc: Oral  PainSc: 4                  Molli Barrows

## 2017-11-17 LAB — SURGICAL PATHOLOGY

## 2017-11-21 ENCOUNTER — Encounter: Payer: Self-pay | Admitting: Urology

## 2017-11-24 ENCOUNTER — Telehealth: Payer: Self-pay | Admitting: Urology

## 2017-11-24 NOTE — Telephone Encounter (Signed)
Pt called office stating that someone called him Friday and talked to him about a problem that he is having, urinary frequency so bad he isn't getting any sleep and can't remember who he talked to, was given some advise on what to do over the weekend to help with his issue, calling this morning to state he is no better and wants to speak to a nurse. Please advise pt at [phone]

## 2017-11-25 ENCOUNTER — Other Ambulatory Visit
Admission: RE | Admit: 2017-11-25 | Discharge: 2017-11-25 | Disposition: A | Discharge status: Home / Self Care | Payer: Medicare Other | Source: Ambulatory Visit | Attending: Urology | Admitting: Urology

## 2017-11-25 DIAGNOSIS — R972 Elevated prostate specific antigen [PSA]: Secondary | ICD-10-CM | POA: Diagnosis present

## 2017-11-25 LAB — PSA: PROSTATIC SPECIFIC ANTIGEN: 19.44 ng/mL — AB (ref 0.00–4.00)

## 2017-11-25 NOTE — Telephone Encounter (Signed)
Spoke to patient and he will just talk to Dr. Erlene Quan on Friday about his concerns

## 2017-11-26 ENCOUNTER — Ambulatory Visit: Payer: Medicare Other | Admitting: Urology

## 2017-11-28 ENCOUNTER — Encounter: Payer: Self-pay | Admitting: Urology

## 2017-11-28 ENCOUNTER — Ambulatory Visit (INDEPENDENT_AMBULATORY_CARE_PROVIDER_SITE_OTHER): Payer: Medicare Other | Admitting: Urology

## 2017-11-28 ENCOUNTER — Other Ambulatory Visit: Payer: Self-pay | Admitting: Urology

## 2017-11-28 ENCOUNTER — Other Ambulatory Visit
Admission: RE | Admit: 2017-11-28 | Discharge: 2017-11-28 | Disposition: A | Discharge status: Home / Self Care | Payer: Medicare Other | Source: Ambulatory Visit | Attending: Urology | Admitting: Urology

## 2017-11-28 VITALS — BP 124/74 | HR 80 | Ht 65.0 in | Wt 185.0 lb

## 2017-11-28 DIAGNOSIS — N401 Enlarged prostate with lower urinary tract symptoms: Secondary | ICD-10-CM

## 2017-11-28 DIAGNOSIS — R3915 Urgency of urination: Secondary | ICD-10-CM | POA: Diagnosis present

## 2017-11-28 DIAGNOSIS — R972 Elevated prostate specific antigen [PSA]: Secondary | ICD-10-CM | POA: Diagnosis not present

## 2017-11-28 DIAGNOSIS — E785 Hyperlipidemia, unspecified: Secondary | ICD-10-CM | POA: Insufficient documentation

## 2017-11-28 DIAGNOSIS — J45909 Unspecified asthma, uncomplicated: Secondary | ICD-10-CM | POA: Insufficient documentation

## 2017-11-28 LAB — URINALYSIS, COMPLETE (UACMP) WITH MICROSCOPIC
Bilirubin Urine: NEGATIVE
Glucose, UA: NEGATIVE mg/dL
Hgb urine dipstick: NEGATIVE
Ketones, ur: NEGATIVE mg/dL
Leukocytes, UA: NEGATIVE
NITRITE: NEGATIVE
PH: 5 (ref 5.0–8.0)
PROTEIN: NEGATIVE mg/dL
RBC / HPF: NONE SEEN RBC/hpf (ref 0–5)
SPECIFIC GRAVITY, URINE: 1.015 (ref 1.005–1.030)
Squamous Epithelial / LPF: NONE SEEN (ref 0–5)

## 2017-11-28 LAB — BLADDER SCAN AMB NON-IMAGING

## 2017-11-28 MED ORDER — FINASTERIDE 5 MG PO TABS: 5.0000 mg | ORAL_TABLET | Freq: Every day | ORAL | 11 refills | Status: DC

## 2017-11-28 MED ORDER — TAMSULOSIN HCL 0.4 MG PO CAPS: 0.8000 mg | ORAL_CAPSULE | Freq: Every day | ORAL | 11 refills | Status: DC

## 2017-11-28 NOTE — Progress Notes (Signed)
In and Out Catheterization  Patient is present today for a I & O catheterization due to urinary frequency. Patient was cleaned and prepped in a sterile fashion with betadine and Lidocaine 2% jelly was instilled into the urethra.  A 14FR cath was inserted no complications were noted , 66ml of urine return was noted, urine was yellow in color. A clean urine sample was collected for UA and culture. Bladder was drained  And catheter was removed with out difficulty.    Preformed by: Fonnie Jarvis, CMA  Follow up/ Additional notes: will call with results

## 2017-11-28 NOTE — Progress Notes (Signed)
11/28/2017 2:20 PM   Jared Freeman [birth year] [id number]  Referring provider: Idelle Crouch, MD [place] [facility] [place], [place] [address]  Chief Complaint  Patient presents with  . Elevated PSA    18month    HPI: 77 year old male with a personal history of elevated PSA returns today for routine six-month follow-up.  His most recent PSA is now up to 19.44 on 11/25/2017.  Over the past several weeks, he is developed severe acute onset worsening of his urinary symptoms including urinary frequency urgency and nocturia.  He was getting up as frequently as once per hour to void at nighttime.  He denies any dysuria or gross hematuria.  No alleviating or exacerbating symptoms.  He called our office and ended up doubling his Flomax last week which is helped somewhat.  He did recently have a rotator cuff surgery.  After that, he developed significantly loose stool, diarrhea which persisted for about a week which is since resolved.  He does not recall having a Foley catheter at the time of surgery.  PVR today 75 cc.  He was catheterized for specimen for UA.   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 Evorn Gong.    He has a personal history of erectile dysfunction on sildenafil prn.   PMH: Past Medical History:  Diagnosis Date  . Anemia   . Anxiety   . Arthritis   . Asthma   . Coronary artery disease   . Diet-controlled type 2 diabetes mellitus ([place]) 08/27/2013  . Gout   . History of colon polyps   . Hyperlipidemia   . Hypertension   . Myocardial infarction ([place])   . Nasal septal defect   . Penile cancer ([place]) 2014  . Polyarthropathy   . Pre-diabetes   . S/P trigger finger release    bilateral  . Sleep apnea   . Urinary outflow obstruction      Surgical History: Past Surgical History:  Procedure Laterality Date  . APPENDECTOMY    . CARDIAC CATHETERIZATION     with stent  . CARPAL TUNNEL RELEASE Bilateral   . COLONOSCOPY    . COLONOSCOPY WITH PROPOFOL N/A 10/03/2014   Procedure: COLONOSCOPY WITH PROPOFOL;  Surgeon: Manya Silvas, MD;  Location: [facility] ENDOSCOPY;  Service: Endoscopy;  Laterality: N/A;  . ESOPHAGOGASTRODUODENOSCOPY    . GANGLION CYST EXCISION    . PENILE BIOPSY  2014   Squamous Cell Carcinoma  . REVERSE SHOULDER ARTHROPLASTY Left 11/11/2017   Procedure: REVERSE SHOULDER ARTHROPLASTY;  Surgeon: Corky Mull, MD;  Location: [facility] ORS;  Service: Orthopedics;  Laterality: Left;  . ROTATOR CUFF REPAIR    . TONSILLECTOMY      Home Medications:  Allergies as of 11/28/2017   No Known Allergies     Medication List        Accurate as of 11/28/17  2:20 PM. Always use your most recent med list.          acetaminophen 500 MG tablet Commonly known as:  TYLENOL Take 875 mg by mouth every 6 (six) hours as needed.   allopurinol 300 MG tablet Commonly known as:  ZYLOPRIM Take 300 mg by mouth daily.   amLODipine-benazepril 5-20 MG capsule Commonly known as:  LOTREL Take 1 capsule by mouth daily.   aspirin  325 MG tablet Take 325 mg by mouth daily.   escitalopram 10 MG tablet Commonly known as:  LEXAPRO Take 10 mg by mouth daily.   hydroxychloroquine 200 MG tablet Commonly known as:  PLAQUENIL Take 100 mg by mouth daily.   leflunomide 10 MG tablet Commonly known as:  ARAVA Take 10 mg by mouth daily.   oxyCODONE 5 MG immediate release tablet Commonly known as:  Oxy IR/ROXICODONE Take 1-2 tablets (5-10 mg total) by mouth every 4 (four) hours as needed for severe pain.   rosuvastatin 20 MG tablet Commonly known as:  CRESTOR Take 20 mg by mouth daily.   sildenafil 20 MG tablet Commonly known as:  REVATIO Take 1 tablet (20 mg total) by mouth as needed. Take 1-5 tabs as needed prior to  intercourse   tamsulosin 0.4 MG Caps capsule Commonly known as:  FLOMAX Take 0.4 mg by mouth daily.   traMADol 50 MG tablet Commonly known as:  ULTRAM Take 1 tablet (50 mg total) by mouth every 6 (six) hours as needed for moderate pain.       Allergies: No Known Allergies  Family History: Family History  Problem Relation Age of Onset  . Stroke Mother   . Heart attack Father     Social History:  reports that he quit smoking about 25 years ago. His smoking use included cigarettes. He has never used smokeless tobacco. He reports that he does not drink alcohol or use drugs.  ROS: UROLOGY Frequent Urination?: Yes Hard to postpone urination?: Yes Burning/pain with urination?: No Get up at night to urinate?: Yes Leakage of urine?: No Urine stream starts and stops?: No Trouble starting stream?: No Do you have to strain to urinate?: No Blood in urine?: No Urinary tract infection?: No Sexually transmitted disease?: No Injury to kidneys or bladder?: No Painful intercourse?: No Weak stream?: Yes Erection problems?: No Penile pain?: No  Gastrointestinal Nausea?: No Vomiting?: No Indigestion/heartburn?: No Diarrhea?: Yes Constipation?: No  Constitutional Fever: No Night sweats?: No Weight loss?: No Fatigue?: No  Skin Skin rash/lesions?: No Itching?: No  Eyes Blurred vision?: No Double vision?: No  Ears/Nose/Throat Sore throat?: No Sinus problems?: Yes  Hematologic/Lymphatic Swollen glands?: No Easy bruising?: Yes  Cardiovascular Leg swelling?: No Chest pain?: No  Respiratory Cough?: Yes Shortness of breath?: No  Endocrine Excessive thirst?: No  Musculoskeletal Back pain?: Yes Joint pain?: No  Neurological Headaches?: No Dizziness?: No  Psychologic Depression?: No Anxiety?: No  Physical Exam: BP 124/74   Pulse 80   Ht 5\' 5"  (1.651 m)   Wt 185 lb (83.9 kg)   BMI 30.79 kg/m   Constitutional:  Alert and oriented, No acute distress.   Accompanied by wife today. Extremity: Left arm in sling HEENT: [place] AT, moist mucus membranes.  Trachea midline, no masses. Cardiovascular: No clubbing, cyanosis, or edema. Respiratory: Normal respiratory effort, no increased work of breathing. Skin: No rashes, bruises or suspicious lesions. Neurologic: Grossly intact, no focal deficits, moving all 4 extremities. Psychiatric: Normal mood and affect.  Laboratory Data: Lab Results  Component Value Date   WBC 8.3 11/12/2017   HGB 14.1 11/12/2017   HCT 41.5 11/12/2017   MCV 91.1 11/12/2017   PLT 195 11/12/2017    Lab Results  Component Value Date   CREATININE 1.65 (H) 11/12/2017    Urinalysis Pending  Pertinent Imaging: Results for orders placed or performed during the hospital encounter of 11/25/17  PSA  Result Value Ref Range   Prostatic Specific  Antigen 19.44 (H) 0.00 - 4.00 ng/mL    Assessment & Plan:    1. Benign prostatic hyperplasia (BPH) with urinary urgency Acute recent worsening of urinary symptoms Adequate emptying today without evidence of urinary tract infection Patient was catheterized today as was not confident a bladder scan results, bladder scan and was consistent with catheterized volume, no concern for retention He had slight improvement over the past week on Flomax 0.8 mg We will plan on keeping with double dose with the addition of finasteride I suspect there is an inflammatory component both with acute rise in his PSA, recent diarrhea and urinary symptoms Supportive care with NSAIDs as tolerated and above management Advised to call if his symptoms fail to improve  - PSA; Future - BLADDER SCAN AMB NON-IMAGING - Urinalysis, Complete w Microscopic; Future - Urine Culture; Future  2. Rising PSA level History of fluctuating PSA with recent acute rise in 19 Given his acute onset of urinary symptoms, I suspect an underlying prostatitis Recommend supportive care Plan to repeat PSA in 2 weeks, if PSA  remains elevated, will consider prostate MRI to rule out underlying high-grade neoplasm Patient is agreeable this plan We will call him in 2 weeks with his PSA results and follow-up plan based on this He understands the importance of getting this lab drawn as it will facilitate how to follow-up appropriately  Will call in 2 weeks with repeat PSA  Hollice Espy, MD  [facility] [address], [employer] [employer] [place], [place] [address] [phone]

## 2017-11-30 LAB — URINE CULTURE: Culture: NO GROWTH

## 2017-12-01 ENCOUNTER — Telehealth: Payer: Self-pay | Admitting: Family Medicine

## 2017-12-01 NOTE — Telephone Encounter (Signed)
Patient notified

## 2017-12-01 NOTE — Telephone Encounter (Signed)
-----   Message from Hollice Espy, MD sent at 11/28/2017  3:34 PM EDT ----- UA is negative.  No sign of infection but we will send a culture as a precaution.  Hollice Espy, MD

## 2017-12-12 ENCOUNTER — Other Ambulatory Visit
Admission: RE | Admit: 2017-12-12 | Discharge: 2017-12-12 | Disposition: A | Discharge status: Home / Self Care | Payer: Medicare Other | Source: Ambulatory Visit | Attending: Urology | Admitting: Urology

## 2017-12-12 DIAGNOSIS — R3915 Urgency of urination: Secondary | ICD-10-CM | POA: Insufficient documentation

## 2017-12-12 DIAGNOSIS — N401 Enlarged prostate with lower urinary tract symptoms: Secondary | ICD-10-CM | POA: Insufficient documentation

## 2017-12-12 LAB — PSA: PROSTATIC SPECIFIC ANTIGEN: 6.91 ng/mL — AB (ref 0.00–4.00)

## 2017-12-16 ENCOUNTER — Telehealth: Payer: Self-pay | Admitting: Urology

## 2017-12-16 ENCOUNTER — Telehealth: Payer: Self-pay

## 2017-12-16 DIAGNOSIS — R972 Elevated prostate specific antigen [PSA]: Secondary | ICD-10-CM

## 2017-12-16 NOTE — Telephone Encounter (Signed)
Pt calling about his PSA results, Pt states he was told someone would call him with results, hasn't heard from anyone, pt states he's very concerned about his results due to the high results a few weeks ago, and would like to know the results before his 1pm appt today with Dr. Doy Hutching.  Please advise pt at [phone]. Thank you.

## 2017-12-16 NOTE — Telephone Encounter (Signed)
Please see results note  Jared Espy, MD

## 2017-12-16 NOTE — Telephone Encounter (Signed)
Called pt informed him of the information below. Pt gave verbal understanding. Appt scheduled.

## 2017-12-16 NOTE — Telephone Encounter (Signed)
-----   Message from Hollice Espy, MD sent at 12/16/2017 12:40 PM EST ----- Please let the patient know that his PSA is come back down nicely, most recent repeat #6.91 which is actually lower than it was 7 months ago.  This is awesome news.  Please have him follow up in 9 months with PSA prior.    Hollice Espy, MD

## 2017-12-17 NOTE — Telephone Encounter (Signed)
Pt was informed of his results on 12/16/17. Please see my result note. Thanks

## 2018-02-06 ENCOUNTER — Other Ambulatory Visit: Payer: Self-pay

## 2018-02-06 ENCOUNTER — Encounter
Admission: RE | Admit: 2018-02-06 | Discharge: 2018-02-06 | Disposition: A | Discharge status: Home / Self Care | Payer: Medicare Other | Source: Ambulatory Visit | Attending: Surgery | Admitting: Surgery

## 2018-02-06 DIAGNOSIS — Z01812 Encounter for preprocedural laboratory examination: Secondary | ICD-10-CM | POA: Insufficient documentation

## 2018-02-06 DIAGNOSIS — M19011 Primary osteoarthritis, right shoulder: Secondary | ICD-10-CM | POA: Insufficient documentation

## 2018-02-06 HISTORY — DX: Dyspnea, unspecified: R06.00

## 2018-02-06 LAB — CBC
HEMATOCRIT: 40.1 % (ref 39.0–52.0)
Hemoglobin: 12.1 g/dL — ABNORMAL LOW (ref 13.0–17.0)
MCH: 27.3 pg (ref 26.0–34.0)
MCHC: 30.2 g/dL (ref 30.0–36.0)
MCV: 90.5 fL (ref 80.0–100.0)
NRBC: 0 % (ref 0.0–0.2)
Platelets: 239 10*3/uL (ref 150–400)
RBC: 4.43 MIL/uL (ref 4.22–5.81)
RDW: 14.3 % (ref 11.5–15.5)
WBC: 6 10*3/uL (ref 4.0–10.5)

## 2018-02-06 LAB — URINALYSIS, ROUTINE W REFLEX MICROSCOPIC
Bilirubin Urine: NEGATIVE
GLUCOSE, UA: NEGATIVE mg/dL
HGB URINE DIPSTICK: NEGATIVE
Ketones, ur: NEGATIVE mg/dL
LEUKOCYTES UA: NEGATIVE
Nitrite: NEGATIVE
PH: 5 (ref 5.0–8.0)
Protein, ur: NEGATIVE mg/dL
Specific Gravity, Urine: 1.016 (ref 1.005–1.030)

## 2018-02-06 LAB — BASIC METABOLIC PANEL
Anion gap: 7 (ref 5–15)
BUN: 28 mg/dL — ABNORMAL HIGH (ref 8–23)
CHLORIDE: 107 mmol/L (ref 98–111)
CO2: 24 mmol/L (ref 22–32)
CREATININE: 1.59 mg/dL — AB (ref 0.61–1.24)
Calcium: 8.2 mg/dL — ABNORMAL LOW (ref 8.9–10.3)
GFR calc non Af Amer: 42 mL/min — ABNORMAL LOW (ref >60)
GFR, EST AFRICAN AMERICAN: 48 mL/min — AB (ref >60)
GLUCOSE: 136 mg/dL — AB (ref 70–99)
Potassium: 3.7 mmol/L (ref 3.5–5.1)
Sodium: 138 mmol/L (ref 135–145)

## 2018-02-06 LAB — TYPE AND SCREEN
ABO/RH(D): B NEG
ANTIBODY SCREEN: NEGATIVE

## 2018-02-06 LAB — SURGICAL PCR SCREEN
MRSA, PCR: NEGATIVE
Staphylococcus aureus: NEGATIVE

## 2018-02-06 NOTE — Pre-Procedure Instructions (Signed)
Faxed request for H&P to Dr. Roland Rack. Fax confirmation received.

## 2018-02-06 NOTE — Patient Instructions (Signed)
Your procedure is scheduled on: Thursday 02/19/18.  Report to DAY SURGERY DEPARTMENT LOCATED ON 2ND FLOOR MEDICAL MALL ENTRANCE. To find out your arrival time please call [phone] between 1PM - 3PM on Wednesday 02/18/18.   Remember: Instructions that are not followed completely may result in serious medical risk, up to and including death, or upon the discretion of your surgeon and anesthesiologist your surgery may need to be rescheduled.      _X__ 1. Do not eat food after midnight the night before your procedure.                 No gum chewing or hard candies. You may drink clear liquids up to 2 hours                 before you are scheduled to arrive for your surgery- DO NOT drink clear                 liquids within 2 hours of the start of your surgery.                 Clear Liquids include:  water, apple juice without pulp, clear carbohydrate                 drink such as Clearfast or Gatorade, Black Coffee or Tea (Do not add                 creamer to coffee or tea).  __X__2.  On the morning of surgery brush your teeth with toothpaste and water, you may rinse your mouth with mouthwash if you wish.  Do not swallow any toothpaste or mouthwash.     _X__ 3.  No Alcohol for 24 hours before or after surgery.  __X__4.  Notify your doctor if there is any change in your medical condition      (cold, fever, infections).     Do not wear jewelry, make-up, hairpins, clips or nail polish. Do not wear lotions, powders, or perfumes.  Do not wear deodorant. Do not shave 48 hours prior to surgery. Men may shave face and neck. Do not bring valuables to the hospital.    [facility] is not responsible for any belongings or valuables.  Contacts, dentures/partials or body piercings may not be worn into surgery. Bring a case for your contacts, glasses or hearing aids, a denture cup will be supplied.  Leave your suitcase in the car. After surgery it may be brought to your room.  For patients  admitted to the hospital, discharge time is determined by your treatment team.      Please read over the following fact sheets that you were given:   MRSA Information    __X__ Take these medicines the morning of surgery with A SIP OF WATER:     1. acetaminophen (TYLENOL) 500 MG tablet   2. allopurinol (ZYLOPRIM) 300 MG tablet  3. escitalopram (LEXAPRO) 10 MG tablet  4. finasteride (PROSCAR) 5 MG tablet  5. fluticasone (FLOVENT HFA) 220 MCG/ACT inhaler  6. gabapentin (NEURONTIN) 300 MG capsule  7. traMADol (ULTRAM) 50 MG tablet (if needed)  8. tamsulosin (FLOMAX) 0.4 MG CAPS capsule  9. rosuvastatin (CRESTOR) 20 MG tablet       __X__ Use CHG Soap as directed   _ X___ Use inhalers on the day of surgery. Also bring the inhaler with you to the hospital on the morning of surgery.   __X__ Stop Blood Thinners: Aspirin Last dose on  02/11/18.    __X__ Stop Anti-inflammatories 7 days before surgery such as Advil, Ibuprofen, Motrin, BC or Goodies Powder, Naprosyn, Naproxen, Aleve, Aspirin, Meloxicam. May take Tylenol or Tramadol if needed for pain or discomfort.    __X__ Do not begin taking any new herbal supplements before your surgery.   __X__ Bring C-Pap to the hospital.

## 2018-02-07 LAB — URINE CULTURE

## 2018-02-18 MED ORDER — CEFAZOLIN SODIUM-DEXTROSE 2-4 GM/100ML-% IV SOLN
2.0000 g | Freq: Once | INTRAVENOUS | Status: AC
  Administered 2018-02-19: 2 g via INTRAVENOUS

## 2018-02-19 ENCOUNTER — Other Ambulatory Visit: Payer: Self-pay

## 2018-02-19 ENCOUNTER — Inpatient Hospital Stay: Payer: Medicare Other | Admitting: Anesthesiology

## 2018-02-19 ENCOUNTER — Encounter
Admission: RE | Disposition: A | Discharge status: Home / Self Care | Payer: Self-pay | Source: Home / Self Care | Attending: Surgery

## 2018-02-19 ENCOUNTER — Inpatient Hospital Stay
Admission: RE | Admit: 2018-02-19 | Discharge: 2018-02-19 | DRG: 483 | Disposition: A | Discharge status: Home / Self Care | Payer: Medicare Other | Attending: Surgery | Admitting: Surgery

## 2018-02-19 ENCOUNTER — Inpatient Hospital Stay: Payer: Medicare Other

## 2018-02-19 DIAGNOSIS — Z8249 Family history of ischemic heart disease and other diseases of the circulatory system: Secondary | ICD-10-CM | POA: Diagnosis not present

## 2018-02-19 DIAGNOSIS — Z823 Family history of stroke: Secondary | ICD-10-CM

## 2018-02-19 DIAGNOSIS — Z8549 Personal history of malignant neoplasm of other male genital organs: Secondary | ICD-10-CM

## 2018-02-19 DIAGNOSIS — I10 Essential (primary) hypertension: Secondary | ICD-10-CM | POA: Diagnosis present

## 2018-02-19 DIAGNOSIS — E785 Hyperlipidemia, unspecified: Secondary | ICD-10-CM | POA: Diagnosis present

## 2018-02-19 DIAGNOSIS — Z6833 Body mass index (BMI) 33.0-33.9, adult: Secondary | ICD-10-CM | POA: Diagnosis not present

## 2018-02-19 DIAGNOSIS — E669 Obesity, unspecified: Secondary | ICD-10-CM | POA: Diagnosis present

## 2018-02-19 DIAGNOSIS — Z7982 Long term (current) use of aspirin: Secondary | ICD-10-CM | POA: Diagnosis not present

## 2018-02-19 DIAGNOSIS — M19011 Primary osteoarthritis, right shoulder: Secondary | ICD-10-CM | POA: Diagnosis present

## 2018-02-19 DIAGNOSIS — Z79899 Other long term (current) drug therapy: Secondary | ICD-10-CM | POA: Diagnosis not present

## 2018-02-19 DIAGNOSIS — E119 Type 2 diabetes mellitus without complications: Secondary | ICD-10-CM | POA: Diagnosis present

## 2018-02-19 DIAGNOSIS — M75121 Complete rotator cuff tear or rupture of right shoulder, not specified as traumatic: Principal | ICD-10-CM | POA: Diagnosis present

## 2018-02-19 DIAGNOSIS — Z87891 Personal history of nicotine dependence: Secondary | ICD-10-CM

## 2018-02-19 DIAGNOSIS — G4730 Sleep apnea, unspecified: Secondary | ICD-10-CM | POA: Diagnosis present

## 2018-02-19 DIAGNOSIS — Z79891 Long term (current) use of opiate analgesic: Secondary | ICD-10-CM

## 2018-02-19 DIAGNOSIS — Z86010 Personal history of colonic polyps: Secondary | ICD-10-CM

## 2018-02-19 DIAGNOSIS — I2510 Atherosclerotic heart disease of native coronary artery without angina pectoris: Secondary | ICD-10-CM | POA: Diagnosis present

## 2018-02-19 DIAGNOSIS — J449 Chronic obstructive pulmonary disease, unspecified: Secondary | ICD-10-CM | POA: Diagnosis present

## 2018-02-19 DIAGNOSIS — Z888 Allergy status to other drugs, medicaments and biological substances status: Secondary | ICD-10-CM

## 2018-02-19 DIAGNOSIS — Z7951 Long term (current) use of inhaled steroids: Secondary | ICD-10-CM | POA: Diagnosis not present

## 2018-02-19 DIAGNOSIS — Z96611 Presence of right artificial shoulder joint: Secondary | ICD-10-CM

## 2018-02-19 HISTORY — PX: REVERSE SHOULDER ARTHROPLASTY: SHX5054

## 2018-02-19 SURGERY — ARTHROPLASTY, SHOULDER, TOTAL, REVERSE
Anesthesia: General | Site: Shoulder | Laterality: Right

## 2018-02-19 MED ORDER — ASPIRIN 325 MG PO TABS
325.0000 mg | ORAL_TABLET | Freq: Every day | ORAL | Status: DC
  Filled 2018-02-19: qty 1

## 2018-02-19 MED ORDER — MAGNESIUM HYDROXIDE 400 MG/5ML PO SUSP
30.0000 mL | Freq: Every day | ORAL | Status: DC | PRN
  Filled 2018-02-19: qty 30

## 2018-02-19 MED ORDER — BUPIVACAINE HCL (PF) 0.5 % IJ SOLN
INTRAMUSCULAR | Status: AC
  Filled 2018-02-19: qty 10

## 2018-02-19 MED ORDER — LIDOCAINE HCL (CARDIAC) PF 100 MG/5ML IV SOSY
PREFILLED_SYRINGE | INTRAVENOUS | Status: DC | PRN
  Administered 2018-02-19: 100 mg via INTRAVENOUS

## 2018-02-19 MED ORDER — METOCLOPRAMIDE HCL 5 MG/ML IJ SOLN: 5.0000 mg | Freq: Three times a day (TID) | INTRAMUSCULAR | Status: DC | PRN

## 2018-02-19 MED ORDER — PHENYLEPHRINE HCL 10 MG/ML IJ SOLN
INTRAMUSCULAR | Status: DC | PRN
  Administered 2018-02-19: 200 ug via INTRAVENOUS
  Administered 2018-02-19: 100 ug via INTRAVENOUS
  Administered 2018-02-19 (×3): 200 ug via INTRAVENOUS

## 2018-02-19 MED ORDER — MIDAZOLAM HCL 2 MG/2ML IJ SOLN
INTRAMUSCULAR | Status: AC
  Filled 2018-02-19: qty 2

## 2018-02-19 MED ORDER — BUPIVACAINE-EPINEPHRINE (PF) 0.5% -1:200000 IJ SOLN
INTRAMUSCULAR | Status: AC
  Filled 2018-02-19: qty 90

## 2018-02-19 MED ORDER — PROPOFOL 10 MG/ML IV BOLUS
INTRAVENOUS | Status: AC
  Filled 2018-02-19: qty 20

## 2018-02-19 MED ORDER — EPHEDRINE SULFATE 50 MG/ML IJ SOLN
INTRAMUSCULAR | Status: DC | PRN
  Administered 2018-02-19: 10 mg via INTRAVENOUS
  Administered 2018-02-19 (×2): 15 mg via INTRAVENOUS

## 2018-02-19 MED ORDER — SUGAMMADEX SODIUM 200 MG/2ML IV SOLN
INTRAVENOUS | Status: DC | PRN
  Administered 2018-02-19: 100 mg via INTRAVENOUS

## 2018-02-19 MED ORDER — AMLODIPINE BESY-BENAZEPRIL HCL 5-20 MG PO CAPS: 1.0000 | ORAL_CAPSULE | Freq: Every day | ORAL | Status: DC

## 2018-02-19 MED ORDER — TRANEXAMIC ACID 1000 MG/10ML IV SOLN
INTRAVENOUS | Status: AC
  Filled 2018-02-19: qty 10

## 2018-02-19 MED ORDER — TRAMADOL HCL 50 MG PO TABS: 50.0000 mg | ORAL_TABLET | Freq: Four times a day (QID) | ORAL | Status: DC | PRN

## 2018-02-19 MED ORDER — DOCUSATE SODIUM 100 MG PO CAPS
100.0000 mg | ORAL_CAPSULE | Freq: Two times a day (BID) | ORAL | Status: DC
  Filled 2018-02-19: qty 1

## 2018-02-19 MED ORDER — OXYCODONE HCL 5 MG PO TABS: 5.0000 mg | ORAL_TABLET | ORAL | Status: DC | PRN

## 2018-02-19 MED ORDER — ENOXAPARIN SODIUM 40 MG/0.4ML ~~LOC~~ SOLN
40.0000 mg | SUBCUTANEOUS | Status: DC
  Filled 2018-02-19: qty 0.4

## 2018-02-19 MED ORDER — FINASTERIDE 5 MG PO TABS
5.0000 mg | ORAL_TABLET | Freq: Every day | ORAL | Status: DC
  Filled 2018-02-19: qty 1

## 2018-02-19 MED ORDER — SODIUM CHLORIDE FLUSH 0.9 % IV SOLN
INTRAVENOUS | Status: AC
  Filled 2018-02-19: qty 30

## 2018-02-19 MED ORDER — ONDANSETRON HCL 4 MG/2ML IJ SOLN
INTRAMUSCULAR | Status: DC | PRN
  Administered 2018-02-19: 4 mg via INTRAVENOUS

## 2018-02-19 MED ORDER — BUPIVACAINE-EPINEPHRINE (PF) 0.5% -1:200000 IJ SOLN
INTRAMUSCULAR | Status: DC | PRN
  Administered 2018-02-19: 30 mL

## 2018-02-19 MED ORDER — BACITRACIN 50000 UNITS IM SOLR
INTRAMUSCULAR | Status: AC
  Filled 2018-02-19: qty 3

## 2018-02-19 MED ORDER — FENTANYL CITRATE (PF) 100 MCG/2ML IJ SOLN: 25.0000 ug | INTRAMUSCULAR | Status: DC | PRN

## 2018-02-19 MED ORDER — ROSUVASTATIN CALCIUM 20 MG PO TABS
20.0000 mg | ORAL_TABLET | Freq: Every day | ORAL | Status: DC
  Filled 2018-02-19: qty 1

## 2018-02-19 MED ORDER — LACTATED RINGERS IV SOLN
INTRAVENOUS | Status: DC
  Administered 2018-02-19: 1000 mL via INTRAVENOUS
  Administered 2018-02-19: 07:00:00 via INTRAVENOUS

## 2018-02-19 MED ORDER — FENTANYL CITRATE (PF) 100 MCG/2ML IJ SOLN
50.0000 ug | Freq: Once | INTRAMUSCULAR | Status: AC
  Administered 2018-02-19: 50 ug via INTRAVENOUS

## 2018-02-19 MED ORDER — FAMOTIDINE 20 MG PO TABS
ORAL_TABLET | ORAL | Status: AC
  Filled 2018-02-19: qty 1

## 2018-02-19 MED ORDER — OXYCODONE HCL 5 MG PO TABS: 5.0000 mg | ORAL_TABLET | ORAL | 0 refills | Status: DC | PRN

## 2018-02-19 MED ORDER — TRAMADOL HCL 50 MG PO TABS: 50.0000 mg | ORAL_TABLET | Freq: Four times a day (QID) | ORAL | 0 refills | Status: AC | PRN

## 2018-02-19 MED ORDER — BISACODYL 10 MG RE SUPP
10.0000 mg | Freq: Every day | RECTAL | Status: DC | PRN
  Filled 2018-02-19: qty 1

## 2018-02-19 MED ORDER — TRANEXAMIC ACID 1000 MG/10ML IV SOLN
INTRAVENOUS | Status: DC | PRN
  Administered 2018-02-19: 1000 mg via INTRAVENOUS

## 2018-02-19 MED ORDER — CEFAZOLIN SODIUM-DEXTROSE 2-4 GM/100ML-% IV SOLN
INTRAVENOUS | Status: AC
  Filled 2018-02-19: qty 100

## 2018-02-19 MED ORDER — ONDANSETRON HCL 4 MG PO TABS: 4.0000 mg | ORAL_TABLET | Freq: Four times a day (QID) | ORAL | Status: DC | PRN

## 2018-02-19 MED ORDER — SILDENAFIL CITRATE 20 MG PO TABS
20.0000 mg | ORAL_TABLET | ORAL | Status: DC | PRN
  Filled 2018-02-19: qty 5

## 2018-02-19 MED ORDER — FAMOTIDINE 20 MG PO TABS
20.0000 mg | ORAL_TABLET | Freq: Once | ORAL | Status: AC
  Administered 2018-02-19: 20 mg via ORAL

## 2018-02-19 MED ORDER — ACETAMINOPHEN 500 MG PO TABS: 1000.0000 mg | ORAL_TABLET | Freq: Four times a day (QID) | ORAL | Status: DC

## 2018-02-19 MED ORDER — CEFAZOLIN SODIUM-DEXTROSE 2-4 GM/100ML-% IV SOLN: 2.0000 g | Freq: Four times a day (QID) | INTRAVENOUS | Status: DC

## 2018-02-19 MED ORDER — GABAPENTIN 300 MG PO CAPS: 600.0000 mg | ORAL_CAPSULE | Freq: Two times a day (BID) | ORAL | Status: DC

## 2018-02-19 MED ORDER — SODIUM CHLORIDE 0.9 % IV SOLN
INTRAVENOUS | Status: DC | PRN
  Administered 2018-02-19: 3000 mL

## 2018-02-19 MED ORDER — BUPIVACAINE LIPOSOME 1.3 % IJ SUSP
INTRAMUSCULAR | Status: DC | PRN
  Administered 2018-02-19: 20 mL via PERINEURAL

## 2018-02-19 MED ORDER — BUPIVACAINE HCL (PF) 0.5 % IJ SOLN
INTRAMUSCULAR | Status: DC | PRN
  Administered 2018-02-19: 10 mL via PERINEURAL

## 2018-02-19 MED ORDER — BENAZEPRIL HCL 20 MG PO TABS
20.0000 mg | ORAL_TABLET | Freq: Every day | ORAL | Status: DC
  Filled 2018-02-19: qty 1

## 2018-02-19 MED ORDER — ACETAMINOPHEN 10 MG/ML IV SOLN
INTRAVENOUS | Status: DC | PRN
  Administered 2018-02-19: 1000 mg via INTRAVENOUS

## 2018-02-19 MED ORDER — ROCURONIUM BROMIDE 100 MG/10ML IV SOLN
INTRAVENOUS | Status: DC | PRN
  Administered 2018-02-19: 50 mg via INTRAVENOUS

## 2018-02-19 MED ORDER — SODIUM CHLORIDE 0.9 % IV SOLN: INTRAVENOUS | Status: DC

## 2018-02-19 MED ORDER — ACETAMINOPHEN 10 MG/ML IV SOLN
INTRAVENOUS | Status: AC
  Filled 2018-02-19: qty 100

## 2018-02-19 MED ORDER — BUPIVACAINE LIPOSOME 1.3 % IJ SUSP
INTRAMUSCULAR | Status: AC
  Filled 2018-02-19: qty 20

## 2018-02-19 MED ORDER — FENTANYL CITRATE (PF) 100 MCG/2ML IJ SOLN
INTRAMUSCULAR | Status: AC
  Filled 2018-02-19: qty 2

## 2018-02-19 MED ORDER — BUDESONIDE 0.5 MG/2ML IN SUSP
0.5000 mg | Freq: Two times a day (BID) | RESPIRATORY_TRACT | Status: DC
  Filled 2018-02-19: qty 2

## 2018-02-19 MED ORDER — TAMSULOSIN HCL 0.4 MG PO CAPS
0.8000 mg | ORAL_CAPSULE | Freq: Every day | ORAL | Status: DC
  Filled 2018-02-19: qty 2

## 2018-02-19 MED ORDER — DIPHENHYDRAMINE HCL 12.5 MG/5ML PO ELIX
12.5000 mg | ORAL_SOLUTION | ORAL | Status: DC | PRN
  Filled 2018-02-19: qty 10

## 2018-02-19 MED ORDER — FENTANYL CITRATE (PF) 100 MCG/2ML IJ SOLN
INTRAMUSCULAR | Status: DC | PRN
  Administered 2018-02-19: 100 ug via INTRAVENOUS

## 2018-02-19 MED ORDER — LIDOCAINE HCL (PF) 1 % IJ SOLN
INTRAMUSCULAR | Status: AC
  Filled 2018-02-19: qty 5

## 2018-02-19 MED ORDER — METOCLOPRAMIDE HCL 10 MG PO TABS: 5.0000 mg | ORAL_TABLET | Freq: Three times a day (TID) | ORAL | Status: DC | PRN

## 2018-02-19 MED ORDER — ACETAMINOPHEN 325 MG PO TABS: 325.0000 mg | ORAL_TABLET | Freq: Four times a day (QID) | ORAL | Status: DC | PRN

## 2018-02-19 MED ORDER — DEXAMETHASONE SODIUM PHOSPHATE 10 MG/ML IJ SOLN
INTRAMUSCULAR | Status: DC | PRN
  Administered 2018-02-19: 5 mg via INTRAVENOUS

## 2018-02-19 MED ORDER — ONDANSETRON HCL 4 MG/2ML IJ SOLN: 4.0000 mg | Freq: Four times a day (QID) | INTRAMUSCULAR | Status: DC | PRN

## 2018-02-19 MED ORDER — HYDROMORPHONE HCL 1 MG/ML IJ SOLN: 0.2500 mg | INTRAMUSCULAR | Status: DC | PRN

## 2018-02-19 MED ORDER — PROPOFOL 10 MG/ML IV BOLUS
INTRAVENOUS | Status: DC | PRN
  Administered 2018-02-19: 120 mg via INTRAVENOUS

## 2018-02-19 MED ORDER — LEFLUNOMIDE 20 MG PO TABS
10.0000 mg | ORAL_TABLET | Freq: Every day | ORAL | Status: DC
  Filled 2018-02-19: qty 0.5

## 2018-02-19 MED ORDER — ALLOPURINOL 300 MG PO TABS
300.0000 mg | ORAL_TABLET | Freq: Every day | ORAL | Status: DC
  Filled 2018-02-19: qty 1

## 2018-02-19 MED ORDER — ESCITALOPRAM OXALATE 10 MG PO TABS
10.0000 mg | ORAL_TABLET | Freq: Every day | ORAL | Status: DC
  Filled 2018-02-19: qty 1

## 2018-02-19 MED ORDER — AMLODIPINE BESYLATE 5 MG PO TABS
5.0000 mg | ORAL_TABLET | Freq: Every day | ORAL | Status: DC
  Filled 2018-02-19: qty 1

## 2018-02-19 MED ORDER — GLYCOPYRROLATE 0.2 MG/ML IJ SOLN
INTRAMUSCULAR | Status: DC | PRN
  Administered 2018-02-19: 0.2 mg via INTRAVENOUS

## 2018-02-19 MED ORDER — FLEET ENEMA 7-19 GM/118ML RE ENEM: 1.0000 | ENEMA | Freq: Once | RECTAL | Status: DC | PRN

## 2018-02-19 MED ORDER — LIDOCAINE HCL (PF) 1 % IJ SOLN
INTRAMUSCULAR | Status: DC | PRN
  Administered 2018-02-19: 5 mL via SUBCUTANEOUS

## 2018-02-19 MED ORDER — ONDANSETRON HCL 4 MG/2ML IJ SOLN: 4.0000 mg | Freq: Once | INTRAMUSCULAR | Status: DC | PRN

## 2018-02-19 MED ORDER — MIDAZOLAM HCL 2 MG/2ML IJ SOLN
1.0000 mg | Freq: Once | INTRAMUSCULAR | Status: AC
  Administered 2018-02-19: 1 mg via INTRAVENOUS

## 2018-02-19 SURGICAL SUPPLY — 69 items
BASEPLATE GLENOSPHERE 25 (Plate) ×1 IMPLANT
BASEPLATE GLENOSPHERE 25MM (Plate) ×1 IMPLANT
BEARING HUMERAL 40 STD VITE (Joint) ×2 IMPLANT
BIT DRILL TWIST 2.7 (BIT) ×1 IMPLANT
BIT DRILL TWIST 2.7MM (BIT) ×1
BLADE SAGITTAL AGGR TOOTH XLG (BLADE) ×2 IMPLANT
BLADE SAGITTAL WIDE XTHICK NO (BLADE) ×3 IMPLANT
CANISTER SUCT 1200ML W/VALVE (MISCELLANEOUS) ×3 IMPLANT
CANISTER SUCT 3000ML PPV (MISCELLANEOUS) ×6 IMPLANT
CHLORAPREP W/TINT 26ML (MISCELLANEOUS) ×3 IMPLANT
COOLER POLAR GLACIER W/PUMP (MISCELLANEOUS) ×3 IMPLANT
COVER WAND RF STERILE (DRAPES) ×1 IMPLANT
CRADLE LAMINECT ARM (MISCELLANEOUS) ×3 IMPLANT
DRAPE IMP U-DRAPE 54X76 (DRAPES) ×6 IMPLANT
DRAPE INCISE IOBAN 66X45 STRL (DRAPES) ×6 IMPLANT
DRAPE SHEET LG 3/4 BI-LAMINATE (DRAPES) ×6 IMPLANT
DRAPE TABLE BACK 80X90 (DRAPES) ×3 IMPLANT
DRSG OPSITE POSTOP 4X8 (GAUZE/BANDAGES/DRESSINGS) ×3 IMPLANT
ELECT BLADE 6.5 EXT (BLADE) IMPLANT
ELECT CAUTERY BLADE 6.4 (BLADE) ×3 IMPLANT
GLENOSPHERE VERSADIAL 40 +3 (Joint) ×2 IMPLANT
GLOVE BIO SURGEON STRL SZ7.5 (GLOVE) ×12 IMPLANT
GLOVE BIO SURGEON STRL SZ8 (GLOVE) ×12 IMPLANT
GLOVE BIOGEL PI IND STRL 8 (GLOVE) ×1 IMPLANT
GLOVE BIOGEL PI INDICATOR 8 (GLOVE) ×2
GLOVE INDICATOR 8.0 STRL GRN (GLOVE) ×3 IMPLANT
GOWN STRL REUS W/ TWL LRG LVL3 (GOWN DISPOSABLE) ×1 IMPLANT
GOWN STRL REUS W/ TWL XL LVL3 (GOWN DISPOSABLE) ×1 IMPLANT
GOWN STRL REUS W/TWL LRG LVL3 (GOWN DISPOSABLE) ×2
GOWN STRL REUS W/TWL XL LVL3 (GOWN DISPOSABLE) ×2
HOOD PEEL AWAY FLYTE STAYCOOL (MISCELLANEOUS) ×9 IMPLANT
KIT STABILIZATION SHOULDER (MISCELLANEOUS) ×3 IMPLANT
KIT TURNOVER KIT A (KITS) ×3 IMPLANT
MASK FACE SPIDER DISP (MASK) ×3 IMPLANT
MAT ABSORB  FLUID 56X50 GRAY (MISCELLANEOUS) ×2
MAT ABSORB FLUID 56X50 GRAY (MISCELLANEOUS) ×1 IMPLANT
NDL SAFETY ECLIPSE 18X1.5 (NEEDLE) ×1 IMPLANT
NDL SPNL 20GX3.5 QUINCKE YW (NEEDLE) ×1 IMPLANT
NEEDLE HYPO 18GX1.5 SHARP (NEEDLE) ×2
NEEDLE HYPO 22GX1.5 SAFETY (NEEDLE) ×3 IMPLANT
NEEDLE SPNL 20GX3.5 QUINCKE YW (NEEDLE) ×3 IMPLANT
NS IRRIG 500ML POUR BTL (IV SOLUTION) ×3 IMPLANT
PACK ARTHROSCOPY SHOULDER (MISCELLANEOUS) ×3 IMPLANT
PAD WRAPON POLAR SHDR UNIV (MISCELLANEOUS) ×1 IMPLANT
PIN THREADED REVERSE (PIN) ×2 IMPLANT
PULSAVAC PLUS IRRIG FAN TIP (DISPOSABLE) ×3
SCREW BONE LOCKING 4.75X30X3.5 (Screw) ×2 IMPLANT
SCREW BONE STRL 6.5MMX25MM (Screw) ×2 IMPLANT
SCREW LOCKING STRL 4.75X25X3.5 (Screw) ×2 IMPLANT
SCREW NON-LOCK 4.75MMX15MM (Screw) ×2 IMPLANT
SCREW NON-LOCK 4.75X20X3.5 (Screw) ×2 IMPLANT
SLING ULTRA II M (MISCELLANEOUS) ×3 IMPLANT
SOL .9 NS 3000ML IRR  AL (IV SOLUTION) ×2
SOL .9 NS 3000ML IRR UROMATIC (IV SOLUTION) ×1 IMPLANT
SPONGE LAP 18X18 RF (DISPOSABLE) ×1 IMPLANT
STAPLER SKIN PROX 35W (STAPLE) ×3 IMPLANT
STEM HUMERAL STRL 14MMX140MM (Stem) ×2 IMPLANT
SUT ETHIBOND 0 MO6 C/R (SUTURE) ×3 IMPLANT
SUT FIBERWIRE #2 38 BLUE 1/2 (SUTURE) ×12
SUT VIC AB 0 CT1 36 (SUTURE) ×3 IMPLANT
SUT VIC AB 2-0 CT1 27 (SUTURE) ×4
SUT VIC AB 2-0 CT1 TAPERPNT 27 (SUTURE) ×2 IMPLANT
SUTURE FIBERWR #2 38 BLUE 1/2 (SUTURE) ×4 IMPLANT
SYR 10ML LL (SYRINGE) ×3 IMPLANT
SYR 30ML LL (SYRINGE) IMPLANT
TIP FAN IRRIG PULSAVAC PLUS (DISPOSABLE) ×1 IMPLANT
TRAY FOLEY MTR SLVR 16FR STAT (SET/KITS/TRAYS/PACK) ×1 IMPLANT
TRAY HUM MINI +0 40D +5 (Shoulder) ×2 IMPLANT
WRAPON POLAR PAD SHDR UNIV (MISCELLANEOUS) ×3

## 2018-02-19 NOTE — Anesthesia Procedure Notes (Signed)
Procedure Name: Intubation Performed by: Marcy Siren, CRNA Pre-anesthesia Checklist: Patient identified, Emergency Drugs available, Suction available, Patient being monitored and Timeout performed Patient Re-evaluated:Patient Re-evaluated prior to induction Oxygen Delivery Method: Circle system utilized Preoxygenation: Pre-oxygenation with 100% oxygen Induction Type: IV induction Ventilation: Mask ventilation without difficulty Laryngoscope Size: Mac and 3 Grade View: Grade II Tube type: Oral Number of attempts: 1 Airway Equipment and Method: Stylet Placement Confirmation: ETT inserted through vocal cords under direct vision,  positive ETCO2,  CO2 detector and breath sounds checked- equal and bilateral Secured at: 22 cm Tube secured with: Tape Dental Injury: Teeth and Oropharynx as per pre-operative assessment

## 2018-02-19 NOTE — Anesthesia Post-op Follow-up Note (Signed)
Anesthesia QCDR form completed.        

## 2018-02-19 NOTE — Anesthesia Procedure Notes (Signed)
Anesthesia Regional Block: Interscalene brachial plexus block   Pre-Anesthetic Checklist: ,, timeout performed, Correct Patient, Correct Site, Correct Laterality, Correct Procedure, Correct Position, site marked, Risks and benefits discussed,  Surgical consent,  Pre-op evaluation,  At surgeon's request and post-op pain management  Laterality: Right and Upper  Prep: chloraprep       Needles:  Injection technique: Single-shot  Needle Type: Stimiplex     Needle Length: 5cm  Needle Gauge: 22     Additional Needles:   Procedures:,,,, ultrasound used (permanent image in chart),,,,  Narrative:  Start time: 02/19/2018 7:32 AM End time: 02/19/2018 7:36 AM Injection made incrementally with aspirations every 5 mL.  Performed by: Personally  Anesthesiologist: Martha Clan, MD  Additional Notes: Functioning IV was confirmed and monitors were applied.  A 12mm 22ga Stimuplex needle was used. Sterile prep and drape,hand hygiene and sterile gloves were used.  Negative aspiration and negative test dose prior to incremental administration of local anesthetic. The patient tolerated the procedure well.

## 2018-02-19 NOTE — Discharge Instructions (Signed)
Diet: As you were doing prior to hospitalization   Shower:  May shower but keep the wounds dry, use an occlusive plastic wrap, NO SOAKING IN TUB.  If the bandage gets wet, change with a clean dry gauze.  Dressing:  You may change your dressing as needed. Change the dressing with sterile gauze dressing.    Activity:  Increase activity slowly as tolerated, but follow the weight bearing instructions below.  No lifting or driving for 6 weeks.  Weight Bearing:   Non-weightbearing to the right arm.  To prevent constipation: you may use a stool softener such as -  Colace (over the counter) 100 mg by mouth twice a day  Drink plenty of fluids (prune juice may be helpful) and high fiber foods Miralax (over the counter) for constipation as needed.    Itching:  If you experience itching with your medications, try taking only a single pain pill, or even half a pain pill at a time.  You may take up to 10 pain pills per day, and you can also use benadryl over the counter for itching or also to help with sleep.   Precautions:  If you experience chest pain or shortness of breath - call 911 immediately for transfer to the hospital emergency department!!  If you develop a fever greater that 101 F, purulent drainage from wound, increased redness or drainage from wound, or calf pain-Call [facility]                                              Follow- Up Appointment:  Please call for an appointment to be seen in 2 weeks at [facility]   1) The drugs that you were given will stay in your system until tomorrow so for the next 24 hours you should not:  A) Drive an automobile B) Make any legal decisions C) Drink any alcoholic beverage   2) You may resume regular meals tomorrow.  Today it is better to start with liquids and gradually work up to solid foods.  You may eat anything you prefer, but it is better to start with liquids, then soup  and crackers, and gradually work up to solid foods.   3) Please notify your doctor immediately if you have any unusual bleeding, trouble breathing, redness and pain at the surgery site, drainage, fever, or pain not relieved by medication.    4) Additional Instructions:        Please contact your physician with any problems or Same Day Surgery at [phone], Monday through Friday 6 am to 4 pm, or [place] at [facility] number at [phone].

## 2018-02-19 NOTE — Transfer of Care (Signed)
Immediate Anesthesia Transfer of Care Note  Patient: Jared Freeman  Procedure(s) Performed: REVERSE SHOULDER ARTHROPLASTY (Right Shoulder)  Patient Location: PACU  Anesthesia Type:General  Level of Consciousness: awake  Airway & Oxygen Therapy: Patient Spontanous Breathing  Post-op Assessment: Report given to RN  Post vital signs: stable  Last Vitals:  Vitals Value Taken Time  BP    Temp    Pulse 92 02/19/2018 10:12 AM  Resp 15 02/19/2018 10:12 AM  SpO2 93 % 02/19/2018 10:12 AM  Vitals shown include unvalidated device data.  Last Pain:  Vitals:   02/19/18 0734  TempSrc:   PainSc: 0-No pain         Complications: No apparent anesthesia complications

## 2018-02-19 NOTE — Anesthesia Preprocedure Evaluation (Signed)
Anesthesia Evaluation  Patient identified by MRN, date of birth, ID band Patient awake    Reviewed: Allergy & Precautions, NPO status , Patient's Chart, lab work & pertinent test results  History of Anesthesia Complications Negative for: history of anesthetic complications  Airway Mallampati: II  TM Distance: >3 FB Neck ROM: Full    Dental  (+) Teeth Intact, Caps, Dental Advidsory Given   Pulmonary neg shortness of breath, asthma (as a child) , sleep apnea (not using now since weight loss) , neg recent URI, former smoker,           Cardiovascular hypertension, Pt. on medications (-) angina+ CAD, + Past MI and + Cardiac Stents  (-) CABG (-) dysrhythmias (-) Valvular Problems/Murmurs     Neuro/Psych PSYCHIATRIC DISORDERS Anxiety negative neurological ROS     GI/Hepatic negative GI ROS, Neg liver ROS,   Endo/Other  diabetes, Well Controlled  Renal/GU negative Renal ROS     Musculoskeletal   Abdominal   Peds  Hematology  (+) Blood dyscrasia (pt denies anemia), anemia ,   Anesthesia Other Findings Past Medical History: No date: Anemia No date: Anxiety No date: Arthritis No date: Asthma No date: Coronary artery disease No date: Gout No date: History of colon polyps No date: Hyperlipidemia No date: Hypertension No date: Myocardial infarction ([place]) No date: Nasal septal defect 2014: Penile cancer ([facility]) No date: Polyarthropathy No date: Pre-diabetes No date: S/P trigger finger release     Comment:  bilateral No date: Sleep apnea No date: Urinary outflow obstruction   Reproductive/Obstetrics                             Anesthesia Physical  Anesthesia Plan  ASA: III  Anesthesia Plan: General   Post-op Pain Management:  Regional for Post-op pain   Induction: Intravenous  PONV Risk Score and Plan: 2 and Ondansetron, Dexamethasone and Treatment may vary due to age or medical  condition  Airway Management Planned: Oral ETT  Additional Equipment:   Intra-op Plan:   Post-operative Plan: Extubation in OR  Informed Consent: I have reviewed the patients History and Physical, chart, labs and discussed the procedure including the risks, benefits and alternatives for the proposed anesthesia with the patient or authorized representative who has indicated his/her understanding and acceptance.     Plan Discussed with:   Anesthesia Plan Comments:         Anesthesia Quick Evaluation

## 2018-02-19 NOTE — Discharge Summary (Signed)
Physician Discharge Summary  Patient ID: Jared Freeman MRN: [id number] DOB/AGE: December 26, 1940 78 y.o.  Admit date: 02/19/2018 Discharge date: 02/19/2018  Admission Diagnoses:  Jared Freeman RIGHT SHOULDER  Discharge Diagnoses: Patient Active Problem List   Diagnosis Date Noted  . 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 Hyperlipidemia 11/28/2017  . Asthma without status asthmaticus 11/28/2017  . Status post reverse arthroplasty of shoulder, left 11/11/2017  . Rotator cuff tendinitis, left 10/27/2017  . 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 GAD (generalized anxiety disorder) 08/24/2015  . Rotator cuff tear arthropathy of right shoulder 09/30/2014  . Sleep apnea 08/27/2013  . Urinary outflow obstruction 08/27/2013  . Obesity 08/27/2013  . Hypertension 08/27/2013  . Hx of adenomatous colonic polyps 08/27/2013  . History of ASCVD (atherosclerotic cardiovascular disease) 08/27/2013  . H/O hyperglycemia 08/27/2013  . H/O cardiac catheterization 08/27/2013  . Gout 08/27/2013  . Frequent PVCs 08/27/2013  . Diet-controlled type 2 diabetes mellitus (Jared Freeman) 08/27/2013  . Bilateral claudication of lower limb (Jared Freeman) 08/27/2013  . Squamous cell carcinoma of penis (Jared Freeman) 01/01/2013  . Malignant neoplasm of penis (Jared Freeman) 01/01/2013    Past Medical History:  Diagnosis Date  . Anemia   . Anxiety   . Arthritis   . Asthma   . Coronary artery disease   . Diet-controlled type 2 diabetes mellitus (Jared Freeman) 08/27/2013  . Dyspnea   . Gout   . History of colon polyps   . Hyperlipidemia   . Hypertension   . Myocardial infarction (Jared Freeman)   . Nasal septal defect   . Penile cancer (Jared Freeman) 2014  . Polyarthropathy   . Pre-diabetes   . S/P trigger finger release    bilateral  . Sleep apnea   . Urinary outflow obstruction    Transfusion: None.   Consultants (if any):   Discharged Condition: Improved  Hospital  Course: Jared Freeman is an 78 y.o. male who was admitted 02/19/2018 with a diagnosis of a massive irreparable rotator cuff tear with advanced cuff arthropathy of the right shoulder and went to the operating room on 02/19/2018 and underwent the above named procedures.    Surgeries: Procedure(s): REVERSE SHOULDER ARTHROPLASTY on 02/19/2018 Patient tolerated the surgery well. Taken to PACU where she was stabilized, he was able to eat without any nausea and did not have any pain.  Requested to be discharged home instead of admitted for observation.  Patient's IV was d/c shortly following surgery.  Implants: All press-fit Biomet Comprehensive system with a #14 micro-humeral stem, a 40 mm +5 mm humeral tray with a standard insert, and a mini-base plate with a 40 mm glenosphere with a +3 mm offset.  He was given perioperative antibiotics:  Anti-infectives (From admission, onward)   Start     Dose/Rate Route Frequency Ordered Stop   02/19/18 1400  ceFAZolin (ANCEF) IVPB 2g/100 mL premix     2 g 200 mL/hr over 30 Minutes Intravenous Every 6 hours 02/19/18 1131 02/20/18 0759   02/19/18 0947  bacitracin 50,000 Units in sodium chloride 0.9 % 500 mL irrigation  Status:  Discontinued       As needed 02/19/18 0947 02/19/18 1009   02/19/18 0604  ceFAZolin (ANCEF) 2-4 GM/100ML-% IVPB    Note to Pharmacy:  Jared Freeman   : cabinet override      02/19/18 0604 02/19/18 0800   02/18/18 2215  ceFAZolin (ANCEF) IVPB 2g/100 mL premix     2 g  200 mL/hr over 30 Minutes Intravenous  Once 02/18/18 2205 02/19/18 0800     Recent vital signs:  Vitals:   02/19/18 1235 02/19/18 1246  BP: 103/69 100/64  Pulse: 99 91  Resp: 18 16  Temp: 97.6 F (36.4 C) (!) 96.8 F (36 C)  SpO2: 94%     Recent laboratory studies:  Lab Results  Component Value Date   HGB 12.1 (L) 02/06/2018   HGB 14.1 11/12/2017   HGB 14.7 11/06/2017   Lab Results  Component Value Date   WBC 6.0 02/06/2018   PLT 239 02/06/2018   Lab  Results  Component Value Date   INR 1.0 05/13/2011   Lab Results  Component Value Date   NA 138 02/06/2018   K 3.7 02/06/2018   CL 107 02/06/2018   CO2 24 02/06/2018   BUN 28 (H) 02/06/2018   CREATININE 1.59 (H) 02/06/2018   GLUCOSE 136 (H) 02/06/2018   Discharge Medications:   Allergies as of 02/19/2018   No Known Allergies     Medication List    TAKE these medications   acetaminophen 500 MG tablet Commonly known as:  TYLENOL Take 1,000 mg by mouth every 6 (six) hours as needed for moderate pain.   allopurinol 300 MG tablet Commonly known as:  ZYLOPRIM Take 300 mg by mouth daily.   amLODipine-benazepril 5-20 MG capsule Commonly known as:  LOTREL Take 1 capsule by mouth daily.   aspirin 325 MG tablet Take 325 mg by mouth daily.   escitalopram 10 MG tablet Commonly known as:  LEXAPRO Take 10 mg by mouth daily.   finasteride 5 MG tablet Commonly known as:  PROSCAR Take 1 tablet (5 mg total) by mouth daily.   fluticasone 220 MCG/ACT inhaler Commonly known as:  FLOVENT HFA Inhale 1 puff into the lungs 2 (two) times daily.   gabapentin 300 MG capsule Commonly known as:  NEURONTIN Take 600 mg by mouth 2 (two) times daily.   hydroxychloroquine 200 MG tablet Commonly known as:  PLAQUENIL Take 100 mg by mouth daily.   leflunomide 10 MG tablet Commonly known as:  ARAVA Take 10 mg by mouth daily.   oxyCODONE 5 MG immediate release tablet Commonly known as:  ROXICODONE Take 1-2 tablets (5-10 mg total) by mouth every 4 (four) hours as needed for severe pain.   rosuvastatin 20 MG tablet Commonly known as:  CRESTOR Take 20 mg by mouth daily.   sildenafil 20 MG tablet Commonly known as:  REVATIO Take 1 tablet (20 mg total) by mouth as needed. Take 1-5 tabs as needed prior to intercourse What changed:    how much to take  reasons to take this  additional instructions   tamsulosin 0.4 MG Caps capsule Commonly known as:  FLOMAX Take 2 capsules (0.8 mg  total) by mouth daily.   traMADol 50 MG tablet Commonly known as:  ULTRAM Take 1-2 tablets (50-100 mg total) by mouth every 6 (six) hours as needed for moderate pain. What changed:  how much to take      Diagnostic Studies: Dg Shoulder Right Port  Result Date: 02/19/2018 CLINICAL DATA:  Postop right shoulder replacement EXAM: PORTABLE RIGHT SHOULDER COMPARISON:  MRI 09/18/2017 FINDINGS: Changes of right shoulder replacement. Normal alignment. No hardware bony complicating feature. IMPRESSION: Right shoulder replacement.  No visible complicating feature. Electronically Signed   By: Rolm Baptise M.D.   On: 02/19/2018 10:42   Disposition: Patient was stable following surgery, po intake without nausea,  passing gas.  Stable for discharge home after surgery this afternoon.  Follow-up Information    Lattie Corns, PA-C Follow up in 14 day(s).   Specialty:  Physician Assistant Why:  Electa Sniff information: [place] [address] [phone]          Signed: Judson Roch PA-C 02/19/2018, 1:02 PM

## 2018-02-19 NOTE — H&P (Signed)
Paper H&P to be scanned into permanent record. H&P reviewed and patient re-examined. No changes. 

## 2018-02-19 NOTE — Op Note (Signed)
02/19/2018  10:12 AM  Patient:   Jared Freeman  Pre-Op Diagnosis:   Massive irreparable rotator cuff tear with advanced cuff arthropathy, right shoulder.  Post-Op Diagnosis:   Same  Procedure:   Reverse right total shoulder arthroplasty.  Surgeon:   Pascal Lux, MD  Assistant:   Cameron Proud, PA-C  Anesthesia:   General endotracheal with an interscalene block utilizing Exparel placed preoperatively by the anesthesiologist.  Findings:   As above.  Complications:   None  EBL:    250 cc  Fluids:   600 cc crystalloid  UOP:   None  TT:   None  Drains:   None  Closure:   Staples  Implants:   All press-fit Biomet Comprehensive system with a #14 micro-humeral stem, a 40 mm +5 mm humeral tray with a standard insert, and a mini-base plate with a 40 mm glenosphere with a +3 mm offset.  Brief Clinical Note:   The patient is a 78 year old male with a long history of progressively worsening right shoulder pain and weakness. His symptoms have persisted despite medications, activity modification, etc. His history and examination are consistent with advanced cuff arthropathy with a massive irreparable rotator cuff tear. The patient presents at this time for a reverse right total shoulder arthroplasty.  Procedure:   The patient underwent placement of an interscalene block using Exparel by the anesthesiologist in the preoperative holding area before being brought into the operating room and lain in the supine position. The patient then underwent general endotracheal intubation and anesthesia before the patient was repositioned in the beach chair position using the beach chair positioner. The right shoulder and upper extremity were prepped with ChloraPrep solution before being draped sterilely. Preoperative antibiotics were administered. A standard anterior approach to the shoulder was made through an approximately 4-5 inch incision. The incision was carried down through the subcutaneous  tissues to expose the deltopectoral fascia. The interval between the deltoid and pectoralis muscles was identified and this plane developed, retracting the cephalic vein laterally with the deltoid muscle. The conjoined tendon was identified. Its lateral margin was dissected and the Kolbel self-retraining retractor inserted. The "three sisters" were identified and cauterized. Bursal tissues were removed to improve visualization. The subscapularis tendon was released from its attachment to the lesser tuberosity 1 cm proximal to its insertion and several tagging sutures placed. The inferior capsule was released with care after identifying and protecting the axillary nerve. The proximal humeral cut was made at approximately 25 of retroversion using the extra-medullary guide.   Attention was redirected to the glenoid. The labrum was debrided circumferentially before the center of the glenoid was marked with electrocautery. The guidewire was drilled into the glenoid neck using the appropriate guide. After verifying its position, it was overreamed with the mini-baseplate reamer to create a flat surface. The permanent mini-baseplate was impacted into place. It was stabilized with a 25 x 6.5 mm central screw and four peripheral screws. Locking screws were placed superiorly and inferiorly while nonlocking screws were placed anteriorly and posteriorly. The permanent 40 mm glenosphere with a +3 mm offset was then impacted into place and its Morse taper locking mechanism verified using manual distraction.  Attention was directed to the humeral side. The humeral canal was reamed sequentially beginning with the end-cutting reamer then progressing from a 4 mm reamer up to a 14 mm reamer. This provided excellent circumferential chatter. The canal was broached beginning with a #11 broach and progressing to a #14 broach. This  was left in place and several trial reductions performed using the standard and +5 mm trial humeral  platforms with the +0 mm and +3 mm polyethylene trials. With the +5 mm humeral platform and the +0 polyethylene, the arm demonstrated excellent range of motion as the hand could be brought across the chest to the opposite shoulder and brought to the top of the patient's head and to the patient's ear. The shoulder appeared stable throughout this range of motion. The joint was dislocated and the trial components removed. The permanent #14 micro-stem was impacted into place with care taken to maintain the appropriate version. The permanent 40 mm +5 mm humeral platform with the standard insert was put together on the back table and impacted into place. Again, the [facility] taper locking mechanism was verified using manual distraction. The shoulder was relocated using two finger pressure and again placed through a range of motion with the findings as described above.  The wound was copiously irrigated with bacitracin saline solution using the jet lavage system before a total of 30 cc of 0.5% Sensorcaine with epinephrine was injected into the pericapsular and peri-incisional tissues to help with postoperative analgesia. The subscapularis tendon was reapproximated using #2 FiberWire interrupted sutures. The deltopectoral interval was closed using #0 Vicryl interrupted sutures before the subcutaneous tissues were closed using 2-0 Vicryl interrupted sutures. The skin was closed using staples. Prior to closing the skin, 1 g of transexemic acid in 10 cc of normal saline was injected intra-articularly to help with postoperative bleeding. A sterile occlusive dressing was applied to the wound before the arm was placed into a shoulder immobilizer with an abduction pillow. A Polar Care system also was applied to the shoulder. The patient was then transferred back to a hospital bed before being awakened, extubated, and returned to the recovery room in satisfactory condition after tolerating the procedure well.

## 2018-02-19 NOTE — Anesthesia Postprocedure Evaluation (Signed)
Anesthesia Post Note  Patient: Jared Freeman  Procedure(s) Performed: REVERSE SHOULDER ARTHROPLASTY (Right Shoulder)  Patient location during evaluation: PACU Anesthesia Type: General Level of consciousness: awake and alert Pain management: pain level controlled Vital Signs Assessment: post-procedure vital signs reviewed and stable Respiratory status: spontaneous breathing, nonlabored ventilation, respiratory function stable and patient connected to nasal cannula oxygen Cardiovascular status: blood pressure returned to baseline and stable Postop Assessment: no apparent nausea or vomiting Anesthetic complications: no     Last Vitals:  Vitals:   02/19/18 1246 02/19/18 1350  BP: 100/64 118/79  Pulse: 91 92  Resp: 16 16  Temp: (!) 36 C   SpO2:      Last Pain:  Vitals:   02/19/18 1350  TempSrc:   PainSc: 0-No pain                 Martha Clan

## 2018-02-20 ENCOUNTER — Encounter: Payer: Self-pay | Admitting: Surgery

## 2018-02-20 LAB — SURGICAL PATHOLOGY

## 2018-02-23 NOTE — Addendum Note (Signed)
Addendum  created 02/23/18 1244 by Doreen Salvage, CRNA   Charge Capture section accepted

## 2018-09-09 ENCOUNTER — Other Ambulatory Visit: Payer: Medicare Other

## 2018-09-09 ENCOUNTER — Other Ambulatory Visit: Payer: Self-pay

## 2018-09-09 DIAGNOSIS — R972 Elevated prostate specific antigen [PSA]: Secondary | ICD-10-CM

## 2018-09-10 LAB — PSA: Prostate Specific Ag, Serum: 3 ng/mL (ref 0.0–4.0)

## 2018-09-15 ENCOUNTER — Other Ambulatory Visit: Payer: Self-pay | Admitting: Surgery

## 2018-09-15 DIAGNOSIS — M4807 Spinal stenosis, lumbosacral region: Secondary | ICD-10-CM

## 2018-09-16 ENCOUNTER — Encounter: Payer: Self-pay | Admitting: Urology

## 2018-09-16 ENCOUNTER — Ambulatory Visit (INDEPENDENT_AMBULATORY_CARE_PROVIDER_SITE_OTHER): Payer: Medicare Other | Admitting: Urology

## 2018-09-16 ENCOUNTER — Other Ambulatory Visit: Payer: Self-pay

## 2018-09-16 VITALS — BP 128/83 | HR 86 | Ht 64.0 in | Wt 199.0 lb

## 2018-09-16 DIAGNOSIS — Z8549 Personal history of malignant neoplasm of other male genital organs: Secondary | ICD-10-CM

## 2018-09-16 DIAGNOSIS — N5203 Combined arterial insufficiency and corporo-venous occlusive erectile dysfunction: Secondary | ICD-10-CM

## 2018-09-16 DIAGNOSIS — N401 Enlarged prostate with lower urinary tract symptoms: Secondary | ICD-10-CM | POA: Diagnosis not present

## 2018-09-16 DIAGNOSIS — R3915 Urgency of urination: Secondary | ICD-10-CM

## 2018-09-16 DIAGNOSIS — Z87898 Personal history of other specified conditions: Secondary | ICD-10-CM | POA: Diagnosis not present

## 2018-09-16 NOTE — Progress Notes (Signed)
09/16/2018 9:11 AM   Jared Freeman [birth year] [id number]  Referring provider: Idelle Crouch, MD [facility] [address],  [place] [address]  Chief Complaint  Patient presents with  . Elevated PSA    Follow up    HPI: Jared Freeman is a 78 y.o. male with PMH BPH with LUTS and elevated PSA who was seen today for follow-up on elevated PSA. He was seen most recently by Dr. Erlene Quan on 11/28/2017 with an acute rise in PSA to 19.44 from 7.02 one year prior. He was treated with supportive care for presumed prostatitis; repeat PSA 2 weeks later was 6.91. He is here today for routine follow-up.  Most recent PSA on 7/29/202 was 3.0. He reports urinary urgency without leaking and nocturia x3; he denies gross hematuria. He has sleep apnea and reports compliance with CPAP. He has borderline diabetes. He reports consuming coffee in the morning and caffeinated sodas in the evening as late as 6-7pm.  He takes Flomax BID for urinary urgency. PMH penile cancer; he reports no concerns regarding this today. He also takes sildenafil for ED and reports this has been working well for him. He does not require a refill today.  PMH: Past Medical History:  Diagnosis Date  . Anemia   . Anxiety   . Arthritis   . Asthma   . Coronary artery disease   . Diet-controlled type 2 diabetes mellitus ([place]) 08/27/2013  . Dyspnea   . Gout   . History of colon polyps   . Hyperlipidemia   . Hypertension   . Myocardial infarction ([place])   . Nasal septal defect   . Penile cancer ([place]) 2014  . Polyarthropathy   . Pre-diabetes   . S/P trigger finger release    bilateral  . Sleep apnea   . Urinary outflow obstruction     Surgical History: Past Surgical History:  Procedure Laterality Date  . APPENDECTOMY    . CARDIAC CATHETERIZATION     with stent  . CARPAL TUNNEL RELEASE Bilateral   . COLONOSCOPY    . COLONOSCOPY WITH PROPOFOL N/A 10/03/2014   Procedure: COLONOSCOPY WITH  PROPOFOL;  Surgeon: Manya Silvas, MD;  Location: [facility] ENDOSCOPY;  Service: Endoscopy;  Laterality: N/A;  . ESOPHAGOGASTRODUODENOSCOPY    . GANGLION CYST EXCISION    . PENILE BIOPSY  2014   Squamous Cell Carcinoma  . REVERSE SHOULDER ARTHROPLASTY Left 11/11/2017   Procedure: REVERSE SHOULDER ARTHROPLASTY;  Surgeon: Corky Mull, MD;  Location: [facility] ORS;  Service: Orthopedics;  Laterality: Left;  . REVERSE SHOULDER ARTHROPLASTY Right 02/19/2018   Procedure: REVERSE SHOULDER ARTHROPLASTY;  Surgeon: Corky Mull, MD;  Location: [facility] ORS;  Service: Orthopedics;  Laterality: Right;  . ROTATOR CUFF REPAIR    . TONSILLECTOMY      Home Medications:  Allergies as of 09/16/2018   No Known Allergies     Medication List       Accurate as of September 16, 2018  9:11 AM. If you have any questions, ask your nurse or doctor.        STOP taking these medications   escitalopram 10 MG tablet Commonly known as: LEXAPRO Stopped by: Hollice Espy, MD   oxyCODONE 5 MG immediate release tablet Commonly known as: Roxicodone Stopped by: Hollice Espy, MD     TAKE these medications   acetaminophen 500 MG tablet Commonly known as: TYLENOL Take 1,000 mg by mouth every 6 (six) hours as needed for  moderate pain.   allopurinol 300 MG tablet Commonly known as: ZYLOPRIM Take 300 mg by mouth daily.   amLODipine-benazepril 5-20 MG capsule Commonly known as: LOTREL Take 1 capsule by mouth daily.   aspirin 325 MG tablet Take 325 mg by mouth daily.   finasteride 5 MG tablet Commonly known as: PROSCAR Take 1 tablet (5 mg total) by mouth daily.   fluticasone 220 MCG/ACT inhaler Commonly known as: FLOVENT HFA Inhale 1 puff into the lungs 2 (two) times daily.   gabapentin 300 MG capsule Commonly known as: NEURONTIN Take 600 mg by mouth 2 (two) times daily.   hydroxychloroquine 200 MG tablet Commonly known as: PLAQUENIL Take 100 mg by mouth daily.   leflunomide 10 MG tablet Commonly known as:  ARAVA Take 10 mg by mouth daily.   rosuvastatin 20 MG tablet Commonly known as: CRESTOR Take 20 mg by mouth daily.   sildenafil 20 MG tablet Commonly known as: Revatio Take 1 tablet (20 mg total) by mouth as needed. Take 1-5 tabs as needed prior to intercourse What changed:   how much to take  reasons to take this  additional instructions   tamsulosin 0.4 MG Caps capsule Commonly known as: Flomax Take 2 capsules (0.8 mg total) by mouth daily.   traMADol 50 MG tablet Commonly known as: ULTRAM Take 1-2 tablets (50-100 mg total) by mouth every 6 (six) hours as needed for moderate pain.       Allergies: No Known Allergies  Family History: Family History  Problem Relation Age of Onset  . Stroke Mother   . Heart attack Father     Social History:  reports that he quit smoking about 25 years ago. His smoking use included cigarettes. He has never used smokeless tobacco. He reports that he does not drink alcohol or use drugs.  ROS: UROLOGY Frequent Urination?: Yes Hard to postpone urination?: No Burning/pain with urination?: No Get up at night to urinate?: Yes Leakage of urine?: No Urine stream starts and stops?: No Trouble starting stream?: No Do you have to strain to urinate?: No Blood in urine?: No Urinary tract infection?: No Sexually transmitted disease?: No Injury to kidneys or bladder?: No Painful intercourse?: No Weak stream?: No Erection problems?: No Penile pain?: No  Gastrointestinal Nausea?: No Vomiting?: No Indigestion/heartburn?: No Diarrhea?: No Constipation?: No  Constitutional Fever: No Night sweats?: No Weight loss?: No Fatigue?: No  Skin Skin rash/lesions?: No Itching?: No  Eyes Blurred vision?: No Double vision?: No  Ears/Nose/Throat Sore throat?: No Sinus problems?: No  Hematologic/Lymphatic Swollen glands?: No Easy bruising?: No  Cardiovascular Leg swelling?: No Chest pain?: No  Respiratory Cough?: No Shortness  of breath?: No  Endocrine Excessive thirst?: No  Musculoskeletal Back pain?: No Joint pain?: No  Neurological Headaches?: No Dizziness?: No  Psychologic Depression?: No Anxiety?: No  Physical Exam: BP 128/83   Pulse 86   Ht 5\' 4"  (1.626 m)   Wt 199 lb (90.3 kg)   BMI 34.16 kg/m   Constitutional:  Alert and oriented, No acute distress. Cardiovascular: No clubbing, cyanosis, or edema. Respiratory: Normal respiratory effort, no increased work of breathing. Skin: No rashes, bruises or suspicious lesions. Neurologic: Grossly intact, no focal deficits, moving all 4 extremities. Psychiatric: Normal mood and affect.  Laboratory Data: PSA 09/09/2018 3.0  Assessment & Plan:  1. Elevated PSA Normalized to 3.0. Given patient's age, will discontinue monitoring at this time. DRE deferred for the same reason. Will continue to follow conservatively. -Return in 1  year for follow-up  2. BPH with LUTS/ urinary urgency LUTS include intermittent urinary urgency and nocturia x3. Discussed his risk factors for nocturia including OSA, prediabetes, and caffeine consumption. Regarding urgency, advised him to reduce intake of diuretics including caffeinated beverages, as well as acidic beverages including sodas. Recommended increased water intake. Patient with mild symptoms of OAB; will defer pharmacotherapy for now pending the lifestyle modifications above.  Continue flomax/ finasteride  3. PMH penile cancer Followed by Dr. Evorn Gong  4. Erectile dysfunction Patient reports therapeutic success with sildenafil. Will continue.  Return in about 1 year (around 09/16/2019) for Annual follow-up.  Debroah Loop, PA-C  [facility] [address], [place], [place] [address] [phone]  I have seen and examined the patient, labs/ imaging reviewed and discussed  management with Thomas Hoff.  I reviewed the PA's note and agree with the documented  findings and plan of care.

## 2018-09-26 ENCOUNTER — Ambulatory Visit
Admission: RE | Admit: 2018-09-26 | Discharge: 2018-09-26 | Disposition: A | Discharge status: Home / Self Care | Payer: Medicare Other | Source: Ambulatory Visit | Attending: Surgery | Admitting: Surgery

## 2018-09-26 ENCOUNTER — Other Ambulatory Visit: Payer: Self-pay

## 2018-09-26 DIAGNOSIS — M4807 Spinal stenosis, lumbosacral region: Secondary | ICD-10-CM | POA: Insufficient documentation

## 2018-10-30 ENCOUNTER — Other Ambulatory Visit: Payer: Self-pay | Admitting: *Deleted

## 2018-10-30 MED ORDER — TAMSULOSIN HCL 0.4 MG PO CAPS: 0.8000 mg | ORAL_CAPSULE | Freq: Every day | ORAL | 11 refills | Status: AC

## 2018-11-19 ENCOUNTER — Other Ambulatory Visit: Payer: Self-pay | Admitting: Urology

## 2018-12-05 ENCOUNTER — Ambulatory Visit
Admission: EM | Admit: 2018-12-05 | Discharge: 2018-12-05 | Disposition: A | Discharge status: Home / Self Care | Payer: Medicare Other | Attending: Emergency Medicine | Admitting: Emergency Medicine

## 2018-12-05 ENCOUNTER — Other Ambulatory Visit: Payer: Self-pay

## 2018-12-05 ENCOUNTER — Encounter: Payer: Self-pay | Admitting: Emergency Medicine

## 2018-12-05 DIAGNOSIS — Z23 Encounter for immunization: Secondary | ICD-10-CM | POA: Diagnosis not present

## 2018-12-05 DIAGNOSIS — W208XXA Other cause of strike by thrown, projected or falling object, initial encounter: Secondary | ICD-10-CM | POA: Diagnosis not present

## 2018-12-05 DIAGNOSIS — S01112A Laceration without foreign body of left eyelid and periocular area, initial encounter: Secondary | ICD-10-CM

## 2018-12-05 MED ORDER — TETANUS-DIPHTHERIA TOXOIDS TD 5-2 LFU IM INJ
0.5000 mL | INJECTION | Freq: Once | INTRAMUSCULAR | Status: AC
  Administered 2018-12-05: 0.5 mL via INTRAMUSCULAR

## 2018-12-05 NOTE — ED Triage Notes (Signed)
Patient in today after falling at home in his shop today. Patient has a laceration at his left eye/eyebrow. Last tetanus unknown.

## 2018-12-05 NOTE — Discharge Instructions (Addendum)
Keep area covered for 24 hours. Then keep area clean and dry.   You have 3 stitched to your left eyebrow. They can be removed by a healthcare professional in 7 days.

## 2018-12-05 NOTE — ED Triage Notes (Signed)
Patient denies LOC

## 2018-12-06 DIAGNOSIS — S01112A Laceration without foreign body of left eyelid and periocular area, initial encounter: Secondary | ICD-10-CM | POA: Diagnosis not present

## 2018-12-06 NOTE — ED Provider Notes (Signed)
[facility] - [place], [place]   Name: Jared Freeman DOB: February 28, 1940 MRN: [id number] CSN: [id number] PCP: Idelle Crouch, MD  Arrival date and time:  12/05/18 1130  Chief Complaint:  Fall (DOI 12/05/18) and Facial Laceration (left eye/eyebrow)   NOTE: Prior to seeing the patient today, I have reviewed the triage nursing documentation and vital signs. Clinical staff has updated patient's PMH/PSHx, current medication list, and drug allergies/intolerances to ensure comprehensive history available to assist in medical decision making.   History:   HPI: Jared Freeman is a 78 y.o. male who presents today with complaints of laceration to his eyebrow. He was working in his shop when a item started to fall. He tried to catch the item, but ended up falling with the item. The item fell onto his face and his glasses caused a laceration to his left eyebrow. No LOC with fall.     Past Medical History:  Diagnosis Date   Anemia    Anxiety    Arthritis    Asthma    Coronary artery disease    Diet-controlled type 2 diabetes mellitus ([place]) 08/27/2013   Dyspnea    Gout    History of colon polyps    Hyperlipidemia    Hypertension    Myocardial infarction ([place])    Nasal septal defect    Penile cancer ([place]) 2014   Polyarthropathy    Pre-diabetes    S/P trigger finger release    bilateral   Sleep apnea    Urinary outflow obstruction     Past Surgical History:  Procedure Laterality Date   APPENDECTOMY     CARDIAC CATHETERIZATION     with stent   CARPAL TUNNEL RELEASE Bilateral    COLONOSCOPY     COLONOSCOPY WITH PROPOFOL N/A 10/03/2014   Procedure: COLONOSCOPY WITH PROPOFOL;  Surgeon: Manya Silvas, MD;  Location: [employer];  Service: Endoscopy;  Laterality: N/A;   ESOPHAGOGASTRODUODENOSCOPY     GANGLION CYST EXCISION     PENILE BIOPSY  2014   Squamous Cell Carcinoma   REVERSE SHOULDER ARTHROPLASTY Left 11/11/2017   Procedure:  REVERSE SHOULDER ARTHROPLASTY;  Surgeon: Corky [place], MD;  Location: [facility];  Service: Orthopedics;  Laterality: Left;   REVERSE SHOULDER ARTHROPLASTY Right 02/19/2018   Procedure: REVERSE SHOULDER ARTHROPLASTY;  Surgeon: Corky [place], MD;  Location: [facility];  Service: Orthopedics;  Laterality: Right;   ROTATOR CUFF REPAIR     TONSILLECTOMY      Family History  Problem Relation Age of Onset   Stroke Mother    Heart attack Father     Social History   Tobacco Use   Smoking status: Former Smoker    Types: Cigarettes    Quit date: 10/23/1992    Years since quitting: 26.1   Smokeless tobacco: Never Used  Substance Use Topics   Alcohol use: No    Comment: rare   Drug use: No    Patient Active Problem List   Diagnosis Date Noted   Status post reverse total shoulder replacement, right 02/19/2018   Hyperlipidemia 11/28/2017   Asthma without status asthmaticus 11/28/2017   Status post reverse arthroplasty of shoulder, left 11/11/2017   Rotator cuff tendinitis, left 10/27/2017   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 ([place]) 03/06/2017   GAD (generalized anxiety disorder) 08/24/2015   Rotator cuff tear arthropathy of right shoulder 09/30/2014   Sleep apnea 08/27/2013   Urinary outflow obstruction 08/27/2013   Obesity  08/27/2013   Hypertension 08/27/2013   Hx of adenomatous colonic polyps 08/27/2013   History of ASCVD (atherosclerotic cardiovascular disease) 08/27/2013   H/O hyperglycemia 08/27/2013   H/O cardiac catheterization 08/27/2013   Gout 08/27/2013   Frequent PVCs 08/27/2013   Diet-controlled type 2 diabetes mellitus ([place]) 08/27/2013   Bilateral claudication of lower limb ([place]) 08/27/2013   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    allopurinol (ZYLOPRIM) 300 MG tablet Take 300 mg by mouth daily.   amitriptyline (ELAVIL) 25 MG tablet TAKE 1 TABLET BY MOUTH EVERY DAY AT NIGHT   amLODipine-benazepril (LOTREL) 5-20 MG per capsule Take 1 capsule by mouth daily.   aspirin 325 MG tablet Take 325 mg by mouth daily.   escitalopram (LEXAPRO) 10 MG tablet TAKE 1 TABLET BY MOUTH EVERY DAY   finasteride (PROSCAR) 5 MG tablet Take 1 tablet (5 mg total) by mouth daily.   fluticasone (FLOVENT HFA) 220 MCG/ACT inhaler Inhale 1 puff into the lungs 2 (two) times daily.   hydroxychloroquine (PLAQUENIL) 200 MG tablet Take 100 mg by mouth daily.    leflunomide (ARAVA) 10 MG tablet Take 10 mg by mouth daily.   montelukast (SINGULAIR) 10 MG tablet TAKE 1 TABLET BY MOUTH EVERY DAY AT NIGHT   rosuvastatin (CRESTOR) 20 MG tablet Take 20 mg by mouth daily.    tamsulosin (FLOMAX) 0.4 MG CAPS capsule Take 2 capsules (0.8 mg total) by mouth daily.   traMADol (ULTRAM) 50 MG tablet Take 1-2 tablets (50-100 mg total) by mouth every 6 (six) hours as needed for moderate pain.    Allergies:   Patient has no known allergies.  Review of Systems (ROS): Review of Systems  Skin: Positive for wound.  All other systems reviewed and are negative.    Vital Signs: Today's Vitals   12/05/18 1140 12/05/18 1141 12/05/18 1219  BP: (!) 92/53    Pulse: 93    Resp: 18    Temp: 98.5 F (36.9 C)    TempSrc: Oral    SpO2: 97%    Weight:  200 lb (90.7 kg)   Height:  5\' 5"  (1.651 m)   PainSc:  0-No pain 0-No pain    Physical Exam: Physical Exam Vitals signs and nursing note reviewed.  Constitutional:      Appearance: Normal appearance.  HENT:     Head: Normocephalic. Laceration present.   Neurological:     Mental Status: He is alert.      Urgent Care Treatments / Results:   LABS: PLEASE NOTE: all labs that were ordered this encounter are listed, however only abnormal results are  displayed. Labs Reviewed - No data to display  EKG: -None  RADIOLOGY: No results found.  PROCEDURES: Laceration Repair  Date/Time: 12/06/2018 7:59 AM Performed by: Gertie Baron, NP Authorized by: Gertie Baron, NP   Consent:    Consent obtained:  Verbal   Consent given by:  Patient   Risks discussed:  Poor cosmetic result and infection   Alternatives discussed:  No treatment Anesthesia (see MAR for exact dosages):    Anesthesia method:  Local infiltration   Local anesthetic:  Bupivacaine 0.25% w/o epi Laceration details:    Location:  Face   Face location:  L eyebrow  Length (cm):  3 Repair type:    Repair type:  Simple Pre-procedure details:    Preparation:  Patient was prepped and draped in usual sterile fashion Exploration:    Hemostasis achieved with:  Direct pressure   Wound extent: areolar tissue violated     Contaminated: no   Treatment:    Area cleansed with:  Betadine and Shur-Clens   Amount of cleaning:  Standard   Visualized foreign bodies/material removed: yes   Skin repair:    Repair method:  Sutures   Suture size:  5-0   Suture material:  Nylon   Suture technique:  Simple interrupted   Number of sutures:  3 Approximation:    Approximation:  Close Post-procedure details:    Dressing:  Adhesive bandage   Patient tolerance of procedure:  Tolerated well, no immediate complications    MEDICATIONS RECEIVED THIS VISIT: Medications  tetanus & diphtheria toxoids (adult) (TENIVAC) injection 0.5 mL (0.5 mLs Intramuscular Given 12/05/18 1153)    PERTINENT CLINICAL COURSE NOTES/UPDATES:   Initial Impression / Assessment and Plan / Urgent Care Course:  Pertinent labs & imaging results that were available during my care of the patient were personally reviewed by me and considered in my medical decision making (see lab/imaging section of note for values and interpretations).  Jared Freeman is a 78 y.o. male who presents to [facility]  today with complaints of facial laceration, diagnosed with the same, and treated as such with the procedure above. NP and patient reviewed discharge instructions below during visit.   Patient is well appearing overall in clinic today. He does not appear to be in any acute distress. Presenting symptoms (see HPI) and exam as documented above.   I have reviewed the follow up and strict return precautions for any new or worsening symptoms. Patient is aware of symptoms that would be deemed urgent/emergent, and would thus require further evaluation either here or in the emergency department. At the time of discharge, he verbalized understanding and consent with the discharge plan as it was reviewed with him. All questions were fielded by provider and/or clinic staff prior to patient discharge.    Final Clinical Impressions / Urgent Care Diagnoses:   Final diagnoses:  Laceration of left eyebrow, initial encounter    New Prescriptions:  [place] Controlled Substance Registry consulted? Not Applicable  Meds ordered this encounter  Medications   tetanus & diphtheria toxoids (adult) (TENIVAC) injection 0.5 mL      Discharge Instructions     Keep area covered for 24 hours. Then keep area clean and dry.   You have 3 stitched to your left eyebrow. They can be removed by a healthcare professional in 7 days.     Recommended Follow up Care:  Patient encouraged to follow up with the following provider within the specified time frame, or sooner as dictated by the severity of his symptoms. As always, he was instructed that for any urgent/emergent care needs, he should seek care either here or in the emergency department for more immediate evaluation.   Gertie Baron, DNP, NP-c    Gertie Baron, NP 12/06/18 [phone]

## 2018-12-10 ENCOUNTER — Telehealth: Payer: Self-pay | Admitting: Urology

## 2018-12-10 NOTE — Telephone Encounter (Signed)
Patient called stating that his insurance denied his claim for DOS 09-16-18 due to a filing error for the diagnosis code. I have sent a e mail over to the billing office and I am waiting for a response to see if  we can change the filing order and re bill.    Sharyn Lull

## 2018-12-12 ENCOUNTER — Ambulatory Visit
Admission: EM | Admit: 2018-12-12 | Discharge: 2018-12-12 | Disposition: A | Discharge status: Home / Self Care | Payer: Medicare Other

## 2018-12-12 ENCOUNTER — Encounter: Payer: Self-pay | Admitting: Emergency Medicine

## 2018-12-12 ENCOUNTER — Other Ambulatory Visit: Payer: Self-pay

## 2018-12-12 DIAGNOSIS — S01112D Laceration without foreign body of left eyelid and periocular area, subsequent encounter: Secondary | ICD-10-CM

## 2018-12-12 DIAGNOSIS — Z4802 Encounter for removal of sutures: Secondary | ICD-10-CM

## 2018-12-12 NOTE — ED Triage Notes (Signed)
Patient here for suture removal to his left eyebrow.

## 2019-05-28 ENCOUNTER — Encounter: Payer: Self-pay | Admitting: Urology

## 2019-05-28 NOTE — Telephone Encounter (Signed)
Called patient to discuss symptoms, denies pain/dysuria or any other urinary symptoms. He has been having urgency/frequency increasing the past couple of days. Appointment scheduled. Aware if symptoms increase or worsens seek medical attention. Patient voiced understanding.

## 2019-06-01 ENCOUNTER — Ambulatory Visit (INDEPENDENT_AMBULATORY_CARE_PROVIDER_SITE_OTHER): Payer: Medicare Other | Admitting: Physician Assistant

## 2019-06-01 ENCOUNTER — Other Ambulatory Visit: Payer: Self-pay

## 2019-06-01 ENCOUNTER — Encounter: Payer: Self-pay | Admitting: Physician Assistant

## 2019-06-01 VITALS — BP 135/87 | HR 94 | Ht 65.0 in | Wt 201.0 lb

## 2019-06-01 DIAGNOSIS — R3915 Urgency of urination: Secondary | ICD-10-CM | POA: Diagnosis not present

## 2019-06-01 DIAGNOSIS — N401 Enlarged prostate with lower urinary tract symptoms: Secondary | ICD-10-CM | POA: Diagnosis not present

## 2019-06-01 LAB — URINALYSIS, COMPLETE
Bilirubin, UA: NEGATIVE
Glucose, UA: NEGATIVE
Ketones, UA: NEGATIVE
Leukocytes,UA: NEGATIVE
Nitrite, UA: NEGATIVE
Protein,UA: NEGATIVE
RBC, UA: NEGATIVE
Specific Gravity, UA: 1.015 (ref 1.005–1.030)
Urobilinogen, Ur: 0.2 mg/dL (ref 0.2–1.0)
pH, UA: 5 (ref 5.0–7.5)

## 2019-06-01 LAB — MICROSCOPIC EXAMINATION: RBC, Urine: NONE SEEN /hpf (ref 0–2)

## 2019-06-01 LAB — BLADDER SCAN AMB NON-IMAGING: Scan Result: 12

## 2019-06-01 NOTE — Patient Instructions (Signed)
1. Start Myrbetriq 25mg  daily. Continue Flomax and finasteride. I will see you back in clinic in 1 month to recheck your symptoms and make sure you are emptying your bladder well on the new medication. 2. Decrease your caffeine intake. Please switch to caffeine-free beverages as much as you can, as I think this is contributing to your urinary frequency and urgency.

## 2019-06-01 NOTE — Progress Notes (Signed)
06/01/2019 9:45 AM   Jared Freeman [birth year] [id number]  CC: Urinary urgency, frequency  HPI: Jared Freeman is a 79 y.o. male with PMH BPH with LUTS including urgency and nocturia on finasteride and tamsulosin, penile cancer, ED, and elevated PSA who presents today for evaluation of bothersome urgency and frequency. He is an established [facility] patient who last saw Dr. Erlene Quan on 09/16/2018 for routine follow-up of the above.  Today, he reports stable symptoms of urinary urgency and frequency.  During the day, he voids every 60 to 90 minutes.  He has nocturia x1-2.  He has rare urge incontinence.  He is frustrated with his symptoms and wonders what can be done to improve them.  Patient continues to take finasteride and tamsulosin 0.4 mg twice daily.  He reports drinking 10 cups of coffee every morning, followed by 32 ounces of sweet tea, followed by 24 ounces of diet Pepsi throughout the remainder of the day.  In-office UA and microscopy today positive for uric acid crystals. PVR 60mL.  PMH: Past Medical History:  Diagnosis Date  . Anemia   . Anxiety   . Arthritis   . Asthma   . Coronary artery disease   . Diet-controlled type 2 diabetes mellitus ([place]) 08/27/2013  . Dyspnea   . Gout   . History of colon polyps   . Hyperlipidemia   . Hypertension   . Myocardial infarction ([place])   . Nasal septal defect   . Penile cancer ([place]) 2014  . Polyarthropathy   . Pre-diabetes   . S/P trigger finger release    bilateral  . Sleep apnea   . Urinary outflow obstruction     Surgical History: Past Surgical History:  Procedure Laterality Date  . APPENDECTOMY    . CARDIAC CATHETERIZATION     with stent  . CARPAL TUNNEL RELEASE Bilateral   . COLONOSCOPY    . COLONOSCOPY WITH PROPOFOL N/A 10/03/2014   Procedure: COLONOSCOPY WITH PROPOFOL;  Surgeon: Manya Silvas, MD;  Location[facility];  Service: Endoscopy;  Laterality: N/A;  . ESOPHAGOGASTRODUODENOSCOPY    . GANGLION  CYST EXCISION    . PENILE BIOPSY  2014   Squamous Cell Carcinoma  . REVERSE SHOULDER ARTHROPLASTY Left 11/11/2017   Procedure: REVERSE SHOULDER ARTHROPLASTY;  Surgeon: Corky Mull, MD;  Location: [facility];  Service: Orthopedics;  Laterality: Left;  . REVERSE SHOULDER ARTHROPLASTY Right 02/19/2018   Procedure: REVERSE SHOULDER ARTHROPLASTY;  Surgeon: Corky Mull, MD;  Location: [facility];  Service: Orthopedics;  Laterality: Right;  . ROTATOR CUFF REPAIR    . TONSILLECTOMY      Home Medications:  Allergies as of 06/01/2019   No Known Allergies     Medication List       Accurate as of June 01, 2019  9:45 AM. If you have any questions, ask your nurse or doctor.        acetaminophen 500 MG tablet Commonly known as: TYLENOL Take 1,000 mg by mouth every 6 (six) hours as needed for moderate pain.   albuterol 108 (90 Base) MCG/ACT inhaler Commonly known as: VENTOLIN HFA Inhale into the lungs.   allopurinol 300 MG tablet Commonly known as: ZYLOPRIM Take 300 mg by mouth daily.   amitriptyline 25 MG tablet Commonly known as: ELAVIL TAKE 1 TABLET BY MOUTH EVERY DAY AT NIGHT   amLODipine-benazepril 5-20 MG capsule Commonly known as: LOTREL Take 1 capsule by mouth daily.   aspirin 325 MG tablet Take 325  mg by mouth daily.   escitalopram 10 MG tablet Commonly known as: LEXAPRO TAKE 1 TABLET BY MOUTH EVERY DAY   finasteride 5 MG tablet Commonly known as: PROSCAR Take 1 tablet (5 mg total) by mouth daily.   fluticasone 220 MCG/ACT inhaler Commonly known as: FLOVENT HFA Inhale 1 puff into the lungs 2 (two) times daily.   gabapentin 300 MG capsule Commonly known as: NEURONTIN Take 600 mg by mouth 2 (two) times daily.   hydroxychloroquine 200 MG tablet Commonly known as: PLAQUENIL Take 100 mg by mouth daily.   leflunomide 10 MG tablet Commonly known as: ARAVA Take 10 mg by mouth daily.   montelukast 10 MG tablet Commonly known as: SINGULAIR TAKE 1 TABLET BY MOUTH  EVERY DAY AT NIGHT   rosuvastatin 20 MG tablet Commonly known as: CRESTOR Take 20 mg by mouth daily.   sildenafil 20 MG tablet Commonly known as: Revatio Take 1 tablet (20 mg total) by mouth as needed. Take 1-5 tabs as needed prior to intercourse What changed:   how much to take  reasons to take this  additional instructions   tamsulosin 0.4 MG Caps capsule Commonly known as: Flomax Take 2 capsules (0.8 mg total) by mouth daily.   traMADol 50 MG tablet Commonly known as: ULTRAM Take 1-2 tablets (50-100 mg total) by mouth every 6 (six) hours as needed for moderate pain.       Allergies:  No Known Allergies  Family History: Family History  Problem Relation Age of Onset  . Stroke Mother   . Heart attack Father     Social History:   reports that he quit smoking about 26 years ago. His smoking use included cigarettes. He has never used smokeless tobacco. He reports that he does not drink alcohol or use drugs.  Physical Exam: BP 135/87   Pulse 94   Ht 5\' 5"  (1.651 m)   Wt 201 lb (91.2 kg)   BMI 33.45 kg/m   Constitutional:  Alert and oriented, no acute distress, nontoxic appearing HEENT: [place], AT Cardiovascular: No clubbing, cyanosis, or edema Respiratory: Normal respiratory effort, no increased work of breathing Skin: No rashes, bruises or suspicious lesions Neurologic: Grossly intact, no focal deficits, moving all 4 extremities Psychiatric: Normal mood and affect  Laboratory Data: Results for orders placed or performed in visit on 06/01/19  Microscopic Examination   URINE  Result Value Ref Range   WBC, UA 0-5 0 - 5 /hpf   RBC None seen 0 - 2 /hpf   Epithelial Cells (non renal) 0-10 0 - 10 /hpf   Casts Present (A) None seen /lpf   Cast Type Hyaline casts N/A   Crystals Present (A) N/A   Crystal Type Uric Acid N/A   Bacteria, UA Few None seen/Few  Urinalysis, Complete  Result Value Ref Range   Specific Gravity, UA 1.015 1.005 - 1.030   pH, UA 5.0 5.0 -  7.5   Color, UA Yellow Yellow   Appearance Ur Hazy (A) Clear   Leukocytes,UA Negative Negative   Protein,UA Negative Negative/Trace   Glucose, UA Negative Negative   Ketones, UA Negative Negative   RBC, UA Negative Negative   Bilirubin, UA Negative Negative   Urobilinogen, Ur 0.2 0.2 - 1.0 mg/dL   Nitrite, UA Negative Negative   Microscopic Examination See below:   Bladder Scan (Post Void Residual) in office  Result Value Ref Range   Scan Result 12    Assessment & Plan:   1.  Benign prostatic hyperplasia (BPH) with urinary urgency 79 year old male with a history of BPH with LUTS including urinary urgency and frequency here with stable reports of urgency and frequency in the setting of extensive caffeine intake daily.  Counseled the patient that his problem is likely behavioral in nature and strongly recommended decrease in caffeine intake today.  Additionally, I am willing to start him on Myrbetriq 25 mg daily to assist with urgency and urge incontinence with normal PVR.  We will plan for symptom recheck in 4 weeks.  He expressed understanding. - Urinalysis, Complete - Bladder Scan (Post Void Residual) in office  Return in about 4 weeks (around 06/29/2019) for symptom recheck on Myrbetriq 25mg .  Debroah Loop, PA-C  [facility] [address] [place], [place] [address] [phone]

## 2019-07-01 ENCOUNTER — Other Ambulatory Visit: Payer: Self-pay

## 2019-07-01 ENCOUNTER — Encounter: Payer: Self-pay | Admitting: Physician Assistant

## 2019-07-01 ENCOUNTER — Ambulatory Visit (INDEPENDENT_AMBULATORY_CARE_PROVIDER_SITE_OTHER): Payer: Medicare Other | Admitting: Physician Assistant

## 2019-07-01 ENCOUNTER — Other Ambulatory Visit: Payer: Self-pay | Admitting: Physician Assistant

## 2019-07-01 VITALS — BP 146/91 | HR 91 | Ht 65.0 in | Wt 201.0 lb

## 2019-07-01 DIAGNOSIS — R3915 Urgency of urination: Secondary | ICD-10-CM

## 2019-07-01 DIAGNOSIS — N401 Enlarged prostate with lower urinary tract symptoms: Secondary | ICD-10-CM | POA: Diagnosis not present

## 2019-07-01 LAB — BLADDER SCAN AMB NON-IMAGING: Scan Result: 25

## 2019-07-01 MED ORDER — TROSPIUM CHLORIDE 20 MG PO TABS: 20.0000 mg | ORAL_TABLET | Freq: Two times a day (BID) | ORAL | 0 refills | Status: AC

## 2019-07-01 NOTE — Progress Notes (Signed)
07/01/2019 9:16 AM   Jared Freeman 28-Feb-1940 [id number]  CC: Chief Complaint  Patient presents with  . Follow-up    HPI: Jared Freeman is a 79 y.o. male with PMH BPH with LUTS including urgency and nocturia on finasteride and tamsulosin, penile cancer, ED, and elevated PSA who presents today for symptom recheck and PVR.  I saw him in clinic 1 month ago with reports of stable urinary urgency and frequency that was bothersome for him.  At that time, patient reported extensive caffeine intake throughout the day.  I counseled him to decrease caffeine intake and started him on a trial of Myrbetriq 25 mg daily.  Today, patient reports significant improvement in his urinary symptoms in the past month.  He has stopped drinking diet soda and switched from caffeinated to decaf coffee as much as possible.  He states he is now urinating every 2-3 hours during the day.  He denies urinary leakage.  He has had significant relief in urinary urgency.  PVR 25 mL.    Of note, patient is concerned that Myrbetriq will be cost prohibitive for him.  He states his wife has previously taken this medication and was switched to oxybutynin due to cost concerns.  He denies a history of constipation or dementia.  PMH: Past Medical History:  Diagnosis Date  . Anemia   . Anxiety   . Arthritis   . Asthma   . Coronary artery disease   . Diet-controlled type 2 diabetes mellitus ([place]) 08/27/2013  . Dyspnea   . Gout   . History of colon polyps   . Hyperlipidemia   . Hypertension   . Myocardial infarction ([place])   . Nasal septal defect   . 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 Polyarthropathy   . Pre-diabetes   . S/P trigger finger release    bilateral  . Sleep apnea   . Urinary outflow obstruction     Surgical History: Past Surgical History:  Procedure Laterality Date  . APPENDECTOMY    . CARDIAC CATHETERIZATION     with stent  . CARPAL TUNNEL RELEASE Bilateral   . COLONOSCOPY    . COLONOSCOPY  WITH PROPOFOL N/A 10/03/2014   Procedure: COLONOSCOPY WITH PROPOFOL;  Surgeon: Manya Silvas, MD;  Location: [facility] ENDOSCOPY;  Service: Endoscopy;  Laterality: N/A;  . ESOPHAGOGASTRODUODENOSCOPY    . GANGLION CYST EXCISION    . PENILE BIOPSY  2014   Squamous Cell Carcinoma  . REVERSE SHOULDER ARTHROPLASTY Left 11/11/2017   Procedure: REVERSE SHOULDER ARTHROPLASTY;  Surgeon: Corky Mull, MD;  Location: [facility] ORS;  Service: Orthopedics;  Laterality: Left;  . REVERSE SHOULDER ARTHROPLASTY Right 02/19/2018   Procedure: REVERSE SHOULDER ARTHROPLASTY;  Surgeon: Corky Mull, MD;  Location: [facility] ORS;  Service: Orthopedics;  Laterality: Right;  . ROTATOR CUFF REPAIR    . TONSILLECTOMY      Home Medications:  Allergies as of 07/01/2019   No Known Allergies     Medication List       Accurate as of Jul 01, 2019  9:16 AM. If you have any questions, ask your nurse or doctor.        STOP taking these medications   albuterol 108 (90 Base) MCG/ACT inhaler Commonly known as: VENTOLIN HFA Stopped by: Debroah Loop, PA-C   fluticasone 220 MCG/ACT inhaler Commonly known as: FLOVENT HFA Stopped by: Debroah Loop, PA-C   sildenafil 20 MG tablet Commonly known as: Revatio Stopped by: Debroah Loop, PA-C  TAKE these medications   acetaminophen 500 MG tablet Commonly known as: TYLENOL Take 1,000 mg by mouth every 6 (six) hours as needed for moderate pain.   allopurinol 300 MG tablet Commonly known as: ZYLOPRIM Take 300 mg by mouth daily.   amitriptyline 25 MG tablet Commonly known as: ELAVIL TAKE 1 TABLET BY MOUTH EVERY DAY AT NIGHT   amLODipine-benazepril 5-20 MG capsule Commonly known as: LOTREL Take 1 capsule by mouth daily.   aspirin 325 MG tablet Take 325 mg by mouth daily.   escitalopram 10 MG tablet Commonly known as: LEXAPRO TAKE 1 TABLET BY MOUTH EVERY DAY   finasteride 5 MG tablet Commonly known as: PROSCAR Take 1 tablet (5 mg total) by  mouth daily.   gabapentin 300 MG capsule Commonly known as: NEURONTIN Take 600 mg by mouth 2 (two) times daily.   hydroxychloroquine 200 MG tablet Commonly known as: PLAQUENIL Take 100 mg by mouth daily.   leflunomide 10 MG tablet Commonly known as: ARAVA Take 10 mg by mouth daily.   montelukast 10 MG tablet Commonly known as: SINGULAIR TAKE 1 TABLET BY MOUTH EVERY DAY AT NIGHT   rosuvastatin 20 MG tablet Commonly known as: CRESTOR Take 20 mg by mouth daily.   tamsulosin 0.4 MG Caps capsule Commonly known as: Flomax Take 2 capsules (0.8 mg total) by mouth daily.   traMADol 50 MG tablet Commonly known as: ULTRAM Take 1-2 tablets (50-100 mg total) by mouth every 6 (six) hours as needed for moderate pain.       Allergies:  No Known Allergies  Family History: Family History  Problem Relation Age of Onset  . Stroke Mother   . Heart attack Father     Social History:   reports that he quit smoking about 26 years ago. His smoking use included cigarettes. He has never used smokeless tobacco. He reports that he does not drink alcohol or use drugs.  Physical Exam: BP (!) 146/91   Pulse 91   Ht 5\' 5"  (1.651 m)   Wt 201 lb (91.2 kg)   BMI 33.45 kg/m   Constitutional:  Alert and oriented, no acute distress, nontoxic appearing HEENT: [place], AT Cardiovascular: No clubbing, cyanosis, or edema Respiratory: Normal respiratory effort, no increased work of breathing Skin: No rashes, bruises or suspicious lesions Neurologic: Grossly intact, no focal deficits, moving all 4 extremities Psychiatric: Normal mood and affect  Laboratory Data: Results for orders placed or performed in visit on 07/01/19  Bladder Scan (Post Void Residual) in office  Result Value Ref Range   Scan Result 25    Assessment & Plan:   1. Benign prostatic hyperplasia (BPH) with urinary urgency 79 year old male with PMH BPH with LUTS including urgency and nocturia on finasteride and tamsulosin, penile  cancer, ED, and elevated PSA who reports significant improvement in urgency and frequency on 1 month trial of Myrbetriq 25 mg daily and decreased caffeine intake.  Due to his concerns related to cost of Myrbetriq, I offered him coupons and cost savings program information today that may allow him to remain on this medication.  As an alternative, I offered him trospium, however I explained that this anticholinergic agent carries a greater risk of side effects including constipation and CNS effects.  Patient would like to proceed with a trial of trospium at this time.  If he develops CNS or other bothersome side effects, will attempt to qualify for cost savings program and switch back to Myrbetriq.  I am in agreement  with this plan. - Bladder Scan (Post Void Residual) in office - trospium (SANCTURA) 20 MG tablet; Take 1 tablet (20 mg total) by mouth 2 (two) times daily.  Dispense: 60 tablet; Refill: 0   Return in about 4 weeks (around 07/29/2019) for Symptom recheck with PVR.  Debroah Loop, PA-C  [facility] [address], [place], [place] [address] [phone]

## 2019-07-01 NOTE — Patient Instructions (Signed)
Trospium tablets What is this medicine? TROSPIUM (TROSE pee um) is used to treat overactive bladder. This medicine reduces the amount of bathroom visits. It may also help to control wetting accidents. This medicine may be used for other purposes; ask your health care provider or pharmacist if you have questions. COMMON BRAND NAME(S): [place] What should I tell my health care provider before I take this medicine? They need to know if you have any of these conditions:  difficulty passing urine  glaucoma  intestinal obstruction or constipation  kidney disease  liver disease  an unusual or allergic reaction to trospium, other medicines, foods, dyes, or preservatives  pregnant or trying to get pregnant  breast-feeding How should I use this medicine? Take this medicine by mouth with a glass of water. Follow the directions on the prescription label. Take this medicine on an empty stomach, at least 1 hour before food. Take your doses at regular intervals. Do not take your medicine more often than directed. Talk to your pediatrician regarding the use of this medicine in children. Special care may be needed. Overdosage: If you think you have taken too much of this medicine contact a poison control center or emergency room at once. NOTE: This medicine is only for you. Do not share this medicine with others. What if I miss a dose? If you miss a dose, take it as soon as you can. If it is almost time for your next dose, take only that dose. Do not take double or extra doses. What may interact with this medicine? Do not take this medicine with any of the following medications:  dofetilide This medicine may also interact with the following medications:  digoxin  metformin  morphine  pancuronium  procainamide  some medicines for colds, hay fever, or allergies  tenofovir  vancomycin This list may not describe all possible interactions. Give your health care provider a list of all  the medicines, herbs, non-prescription drugs, or dietary supplements you use. Also tell them if you smoke, drink alcohol, or use illegal drugs. Some items may interact with your medicine. What should I watch for while using this medicine? You may need to limit your intake tea, coffee, caffeinated sodas, and alcohol. These drinks may make your symptoms worse. You may get drowsy or dizzy. Do not drive, use machinery, or do anything that needs mental alertness until you know how this medicine affects you. Do not stand or sit up quickly, especially if you are an older patient. This reduces the risk of dizzy or fainting spells. Alcohol may interfere with the effect of this medicine. Avoid alcoholic drinks. Your mouth may get dry. Chewing sugarless gum or sucking hard candy, and drinking plenty of water may help. Contact your doctor if the problem does not go away or is severe. This medicine may cause dry eyes and blurred vision. If you wear contact lenses you may feel some discomfort. Lubricating drops may help. See your eye doctor if the problem does not go away or is severe. Avoid extreme heat. This medicine can cause you to sweat less than normal. Your body temperature could increase to dangerous levels, which may lead to heat stroke. What side effects may I notice from receiving this medicine? Side effects that you should report to your doctor or health care professional as soon as possible:  allergic reactions like skin rash, itching or hives, swelling of the face, lips, or tongue  blurred vision or difficulty focusing vision  breathing problems  confusion  difficulty passing urine  hallucinations  severe dizziness Side effects that usually do not require medical attention (report to your doctor or health care professional if they continue or are bothersome):  constipation  drowsiness  headache  indigestion or stomach upset  nausea This list may not describe all possible side  effects. Call your doctor for medical advice about side effects. You may report side effects to FDA at 1-800-FDA-1088. Where should I keep my medicine? Keep out of the reach of children. Store at room temperature between 20 and 25 degrees C (68 and 77 degrees F). Throw away any unused medicine after the expiration date. NOTE: This sheet is a summary. It may not cover all possible information. If you have questions about this medicine, talk to your doctor, pharmacist, or health care provider.  2020 Elsevier/Gold Standard (2010-09-12 14:07:06)

## 2019-08-02 ENCOUNTER — Encounter: Payer: Self-pay | Admitting: Physician Assistant

## 2019-08-02 ENCOUNTER — Other Ambulatory Visit: Payer: Self-pay

## 2019-08-02 ENCOUNTER — Ambulatory Visit (INDEPENDENT_AMBULATORY_CARE_PROVIDER_SITE_OTHER): Payer: Medicare Other | Admitting: Physician Assistant

## 2019-08-02 VITALS — BP 137/71 | HR 86 | Ht 65.0 in | Wt 200.0 lb

## 2019-08-02 DIAGNOSIS — R3915 Urgency of urination: Secondary | ICD-10-CM

## 2019-08-02 DIAGNOSIS — N401 Enlarged prostate with lower urinary tract symptoms: Secondary | ICD-10-CM | POA: Diagnosis not present

## 2019-08-02 LAB — BLADDER SCAN AMB NON-IMAGING: Scan Result: 153

## 2019-08-02 NOTE — Progress Notes (Signed)
08/02/2019 11:42 AM   Jared Freeman 1940/06/13 [id number]  CC: Chief Complaint  Patient presents with  . Benign Prostatic Hypertrophy    HPI: Jared Freeman is a 79 y.o. male with PMH BPH with LUTS including urgency and nocturia on finasteride and tamsulosin, penile cancer, ED, and elevated PSA who presents today for symptom recheck and PVR.  I saw him in clinic 1 month ago for symptom recheck on Myrbetriq 25 mg daily.  He reported that his symptoms were significantly improved on this medication and after having switched to decaffeinated coffee, however he requested to switch to trospium due to cost concerns.  Today, patient reports he never filled prescribed trospium.  Instead, he took his wife's leftover oxybutynin x1 week.  He reports significant dry mouth on this medication.  He stopped it thereafter.  He reports that his urinary symptoms have remained tolerable on no OAB medications and supposes that decreasing his caffeine intake has had a drastic improvement on his urinary symptoms.  He wishes to remain off of medications for OAB at this time pending symptom return.  PVR 19mL.  PMH: Past Medical History:  Diagnosis Date  . Anemia   . Anxiety   . Arthritis   . Asthma   . Coronary artery disease   . Diet-controlled type 2 diabetes mellitus ([place]) 08/27/2013  . Dyspnea   . Gout   . History of colon polyps   . Hyperlipidemia   . Hypertension   . Myocardial infarction ([place])   . Nasal septal defect   . 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 Polyarthropathy   . Pre-diabetes   . S/P trigger finger release    bilateral  . Sleep apnea   . Urinary outflow obstruction     Surgical History: Past Surgical History:  Procedure Laterality Date  . APPENDECTOMY    . CARDIAC CATHETERIZATION     with stent  . CARPAL TUNNEL RELEASE Bilateral   . COLONOSCOPY    . COLONOSCOPY WITH PROPOFOL N/A 10/03/2014   Procedure: COLONOSCOPY WITH PROPOFOL;  Surgeon: Manya Silvas, MD;   Location: [facility] ENDOSCOPY;  Service: Endoscopy;  Laterality: N/A;  . ESOPHAGOGASTRODUODENOSCOPY    . GANGLION CYST EXCISION    . PENILE BIOPSY  2014   Squamous Cell Carcinoma  . REVERSE SHOULDER ARTHROPLASTY Left 11/11/2017   Procedure: REVERSE SHOULDER ARTHROPLASTY;  Surgeon: Corky Mull, MD;  Location: [facility] ORS;  Service: Orthopedics;  Laterality: Left;  . REVERSE SHOULDER ARTHROPLASTY Right 02/19/2018   Procedure: REVERSE SHOULDER ARTHROPLASTY;  Surgeon: Corky Mull, MD;  Location: [facility] ORS;  Service: Orthopedics;  Laterality: Right;  . ROTATOR CUFF REPAIR    . TONSILLECTOMY      Home Medications:  Allergies as of 08/02/2019   No Known Allergies     Medication List       Accurate as of August 02, 2019 11:42 AM. If you have any questions, ask your nurse or doctor.        acetaminophen 500 MG tablet Commonly known as: TYLENOL Take 1,000 mg by mouth every 6 (six) hours as needed for moderate pain.   allopurinol 300 MG tablet Commonly known as: ZYLOPRIM Take 300 mg by mouth daily.   amitriptyline 25 MG tablet Commonly known as: ELAVIL TAKE 1 TABLET BY MOUTH EVERY DAY AT NIGHT   amLODipine-benazepril 5-20 MG capsule Commonly known as: LOTREL Take 1 capsule by mouth daily.   aspirin 325 MG tablet Take 325 mg by mouth daily.  escitalopram 10 MG tablet Commonly known as: LEXAPRO TAKE 1 TABLET BY MOUTH EVERY DAY   finasteride 5 MG tablet Commonly known as: PROSCAR Take 1 tablet (5 mg total) by mouth daily.   gabapentin 300 MG capsule Commonly known as: NEURONTIN Take 600 mg by mouth 2 (two) times daily.   hydroxychloroquine 200 MG tablet Commonly known as: PLAQUENIL Take 100 mg by mouth daily.   leflunomide 10 MG tablet Commonly known as: ARAVA Take 10 mg by mouth daily.   montelukast 10 MG tablet Commonly known as: SINGULAIR TAKE 1 TABLET BY MOUTH EVERY DAY AT NIGHT   rosuvastatin 20 MG tablet Commonly known as: CRESTOR Take 20 mg by mouth daily.     tamsulosin 0.4 MG Caps capsule Commonly known as: Flomax Take 2 capsules (0.8 mg total) by mouth daily.   traMADol 50 MG tablet Commonly known as: ULTRAM Take 1-2 tablets (50-100 mg total) by mouth every 6 (six) hours as needed for moderate pain.       Allergies:  No Known Allergies  Family History: Family History  Problem Relation Age of Onset  . Stroke Mother   . Heart attack Father     Social History:   reports that he quit smoking about 26 years ago. His smoking use included cigarettes. He has never used smokeless tobacco. He reports that he does not drink alcohol and does not use drugs.  Physical Exam: BP 137/71   Pulse 86   Ht 5\' 5"  (1.651 m)   Wt 200 lb (90.7 kg)   BMI 33.28 kg/m   Constitutional:  Alert and oriented, no acute distress, nontoxic appearing HEENT: [place], AT Cardiovascular: No clubbing, cyanosis, or edema Respiratory: Normal respiratory effort, no increased work of breathing Skin: No rashes, bruises or suspicious lesions Neurologic: Grossly intact, no focal deficits, moving all 4 extremities Psychiatric: Normal mood and affect  Laboratory Data: Results for orders placed or performed in visit on 08/02/19  Bladder Scan (Post Void Residual) in office  Result Value Ref Range   Scan Result 153    Assessment & Plan:   79 year old male with PMH BPH and LUTS including urgency and nocturia on finasteride and tamsulosin, penile cancer, ED, and elevated PSA who returns today for symptom recheck.  He reports significant improvement in urgency and nocturia through caffeine reduction alone and did experience bothersome xerostomia on oxybutynin. 1. Benign prostatic hyperplasia (BPH) with urinary urgency PVR slightly elevated today, agree with holding off on further OAB medications at this time due to concerns for worsening.  We will continue finasteride and tamsulosin and caffeine restriction.  Counseled patient to return to clinic if his symptoms become  bothersome again.  He is in agreement with this plan. - Bladder Scan (Post Void Residual) in office  Return in about 2 months (around 10/02/2019) for Annual follow-up with Dr. Erlene Quan (scheduled).  Debroah Loop, PA-C  [facility] [address], [place], [place] [address] [phone]

## 2019-09-09 ENCOUNTER — Other Ambulatory Visit: Payer: Self-pay

## 2019-09-09 ENCOUNTER — Other Ambulatory Visit
Admission: RE | Admit: 2019-09-09 | Discharge: 2019-09-09 | Disposition: A | Discharge status: Home / Self Care | Payer: Medicare Other | Source: Ambulatory Visit | Attending: Internal Medicine | Admitting: Internal Medicine

## 2019-09-09 DIAGNOSIS — Z01812 Encounter for preprocedural laboratory examination: Secondary | ICD-10-CM | POA: Insufficient documentation

## 2019-09-09 DIAGNOSIS — Z20822 Contact with and (suspected) exposure to covid-19: Secondary | ICD-10-CM | POA: Diagnosis not present

## 2019-09-09 LAB — SARS CORONAVIRUS 2 (TAT 6-24 HRS): SARS Coronavirus 2: NEGATIVE

## 2019-09-10 ENCOUNTER — Encounter: Payer: Self-pay | Admitting: Internal Medicine

## 2019-09-13 ENCOUNTER — Ambulatory Visit: Payer: Medicare Other | Admitting: Anesthesiology

## 2019-09-13 ENCOUNTER — Encounter: Payer: Self-pay | Admitting: Internal Medicine

## 2019-09-13 ENCOUNTER — Ambulatory Visit
Admission: RE | Admit: 2019-09-13 | Discharge: 2019-09-13 | Disposition: A | Discharge status: Home / Self Care | Payer: Medicare Other | Attending: Internal Medicine | Admitting: Internal Medicine

## 2019-09-13 ENCOUNTER — Other Ambulatory Visit: Payer: Self-pay

## 2019-09-13 ENCOUNTER — Encounter
Admission: RE | Disposition: A | Discharge status: Home / Self Care | Payer: Self-pay | Source: Home / Self Care | Attending: Internal Medicine

## 2019-09-13 DIAGNOSIS — Z79899 Other long term (current) drug therapy: Secondary | ICD-10-CM | POA: Insufficient documentation

## 2019-09-13 DIAGNOSIS — E785 Hyperlipidemia, unspecified: Secondary | ICD-10-CM | POA: Diagnosis not present

## 2019-09-13 DIAGNOSIS — G4730 Sleep apnea, unspecified: Secondary | ICD-10-CM | POA: Diagnosis not present

## 2019-09-13 DIAGNOSIS — K5730 Diverticulosis of large intestine without perforation or abscess without bleeding: Secondary | ICD-10-CM | POA: Insufficient documentation

## 2019-09-13 DIAGNOSIS — M1990 Unspecified osteoarthritis, unspecified site: Secondary | ICD-10-CM | POA: Diagnosis not present

## 2019-09-13 DIAGNOSIS — J45909 Unspecified asthma, uncomplicated: Secondary | ICD-10-CM | POA: Diagnosis not present

## 2019-09-13 DIAGNOSIS — Z7982 Long term (current) use of aspirin: Secondary | ICD-10-CM | POA: Diagnosis not present

## 2019-09-13 DIAGNOSIS — I10 Essential (primary) hypertension: Secondary | ICD-10-CM | POA: Diagnosis not present

## 2019-09-13 DIAGNOSIS — M109 Gout, unspecified: Secondary | ICD-10-CM | POA: Diagnosis not present

## 2019-09-13 DIAGNOSIS — Z1211 Encounter for screening for malignant neoplasm of colon: Secondary | ICD-10-CM | POA: Insufficient documentation

## 2019-09-13 DIAGNOSIS — K640 First degree hemorrhoids: Secondary | ICD-10-CM | POA: Insufficient documentation

## 2019-09-13 DIAGNOSIS — F419 Anxiety disorder, unspecified: Secondary | ICD-10-CM | POA: Diagnosis not present

## 2019-09-13 DIAGNOSIS — E119 Type 2 diabetes mellitus without complications: Secondary | ICD-10-CM | POA: Diagnosis not present

## 2019-09-13 DIAGNOSIS — I2510 Atherosclerotic heart disease of native coronary artery without angina pectoris: Secondary | ICD-10-CM | POA: Insufficient documentation

## 2019-09-13 DIAGNOSIS — I252 Old myocardial infarction: Secondary | ICD-10-CM | POA: Insufficient documentation

## 2019-09-13 DIAGNOSIS — Z86010 Personal history of colonic polyps: Secondary | ICD-10-CM | POA: Insufficient documentation

## 2019-09-13 HISTORY — PX: COLONOSCOPY WITH PROPOFOL: SHX5780

## 2019-09-13 SURGERY — COLONOSCOPY WITH PROPOFOL
Anesthesia: General

## 2019-09-13 MED ORDER — PROPOFOL 10 MG/ML IV BOLUS
INTRAVENOUS | Status: DC | PRN
  Administered 2019-09-13: 70 mg via INTRAVENOUS

## 2019-09-13 MED ORDER — PROPOFOL 500 MG/50ML IV EMUL
INTRAVENOUS | Status: DC | PRN
  Administered 2019-09-13: 120 ug/kg/min via INTRAVENOUS

## 2019-09-13 MED ORDER — LIDOCAINE 2% (20 MG/ML) 5 ML SYRINGE
INTRAMUSCULAR | Status: DC | PRN
  Administered 2019-09-13: 25 mg via INTRAVENOUS

## 2019-09-13 MED ORDER — SODIUM CHLORIDE 0.9 % IV SOLN: INTRAVENOUS | Status: DC

## 2019-09-13 NOTE — Transfer of Care (Signed)
Immediate Anesthesia Transfer of Care Note  Patient: Jared Freeman  Procedure(s) Performed: COLONOSCOPY WITH PROPOFOL (N/A )  Patient Location: Endoscopy Unit  Anesthesia Type:General  Level of Consciousness: awake  Airway & Oxygen Therapy: Patient Spontanous Breathing and Patient connected to nasal cannula oxygen  Post-op Assessment: Report given to RN and Post -op Vital signs reviewed and stable  Post vital signs: Reviewed  Last Vitals:  Vitals Value Taken Time  BP 92/66 09/13/19 1013  Temp    Pulse 71 09/13/19 1013  Resp 14 09/13/19 1013  SpO2 92 % 09/13/19 1013  Vitals shown include unvalidated device data.  Last Pain:  Vitals:   09/13/19 0851  TempSrc: Temporal  PainSc: 0-No pain         Complications: No complications documented.

## 2019-09-13 NOTE — Anesthesia Preprocedure Evaluation (Signed)
Anesthesia Evaluation  Patient identified by MRN, date of birth, ID band Patient awake    Reviewed: Allergy & Precautions, NPO status , Patient's Chart, lab work & pertinent test results  History of Anesthesia Complications Negative for: history of anesthetic complications  Airway Mallampati: II  TM Distance: >3 FB Neck ROM: Full    Dental  (+) Poor Dentition   Pulmonary asthma , sleep apnea , former smoker,    breath sounds clear to auscultation- rhonchi (-) wheezing      Cardiovascular hypertension, Pt. on medications (-) angina+ CAD, + Past MI and + Cardiac Stents  (-) CABG  Rhythm:Regular Rate:Normal - Systolic murmurs and - Diastolic murmurs    Neuro/Psych neg Seizures PSYCHIATRIC DISORDERS Anxiety negative neurological ROS     GI/Hepatic negative GI ROS, Neg liver ROS,   Endo/Other  diabetes (diet controlled)  Renal/GU negative Renal ROS     Musculoskeletal  (+) Arthritis ,   Abdominal (+) + obese,   Peds  Hematology  (+) anemia ,   Anesthesia Other Findings Past Medical History: No date: Anemia No date: Anxiety No date: Arthritis No date: Asthma No date: Coronary artery disease 08/27/2013: Diet-controlled type 2 diabetes mellitus ([facility]) No date: Dyspnea No date: Gout No date: History of colon polyps No date: Hyperlipidemia No date: Hypertension No date: Myocardial infarction ([place]) No date: Nasal septal defect 2014: Penile cancer ([facility]) No date: Polyarthropathy No date: Pre-diabetes No date: S/P trigger finger release     Comment:  bilateral No date: Sleep apnea No date: Urinary outflow obstruction   Reproductive/Obstetrics                             Anesthesia Physical Anesthesia Plan  ASA: III  Anesthesia Plan: General   Post-op Pain Management:    Induction: Intravenous  PONV Risk Score and Plan: 1 and Propofol infusion  Airway Management Planned: Natural  Airway  Additional Equipment:   Intra-op Plan:   Post-operative Plan:   Informed Consent: I have reviewed the patients History and Physical, chart, labs and discussed the procedure including the risks, benefits and alternatives for the proposed anesthesia with the patient or authorized representative who has indicated his/her understanding and acceptance.     Dental advisory given  Plan Discussed with: CRNA and Anesthesiologist  Anesthesia Plan Comments:         Anesthesia Quick Evaluation

## 2019-09-13 NOTE — H&P (Signed)
Outpatient short stay form Pre-procedure 09/13/2019 9:46 AM Miklo Aken K. Alice Reichert, M.D.  Primary Physician: Fulton Reek, M.D.  Reason for visit: Remote personal history of adenomatous colon polyps  History of present illness:                           Patient presents for colonoscopy for a personal hx of colon polyps. The patient denies abdominal pain, abnormal weight loss or rectal bleeding.      Current Facility-Administered Medications:  .  0.9 %  sodium chloride infusion, , Intravenous, Continuous, [place], Benay Pike, MD, Last Rate: 20 mL/hr at 09/13/19 0856, New Bag at 09/13/19 0856  Medications Prior to Admission  Medication Sig Dispense Refill Last Dose  . acetaminophen (TYLENOL) 500 MG tablet Take 1,000 mg by mouth every 6 (six) hours as needed for moderate pain.    09/13/2019 at Unknown time  . allopurinol (ZYLOPRIM) 300 MG tablet Take 300 mg by mouth daily.   09/13/2019 at Unknown time  . amitriptyline (ELAVIL) 25 MG tablet TAKE 1 TABLET BY MOUTH EVERY DAY AT NIGHT   09/12/2019 at Unknown time  . amLODipine-benazepril (LOTREL) 5-20 MG per capsule Take 1 capsule by mouth daily.   09/13/2019 at Unknown time  . escitalopram (LEXAPRO) 10 MG tablet TAKE 1 TABLET BY MOUTH EVERY DAY   09/13/2019 at Unknown time  . hydroxychloroquine (PLAQUENIL) 200 MG tablet Take 200 mg by mouth daily.    09/13/2019 at Unknown time  . leflunomide (ARAVA) 10 MG tablet Take 10 mg by mouth daily.   09/13/2019 at Unknown time  . montelukast (SINGULAIR) 10 MG tablet TAKE 1 TABLET BY MOUTH EVERY DAY AT NIGHT   09/13/2019 at Unknown time  . rosuvastatin (CRESTOR) 20 MG tablet Take 20 mg by mouth daily.    09/13/2019 at Unknown time  . tamsulosin (FLOMAX) 0.4 MG CAPS capsule Take 2 capsules (0.8 mg total) by mouth daily. 30 capsule 11 09/13/2019 at Unknown time  . traMADol (ULTRAM) 50 MG tablet Take 1-2 tablets (50-100 mg total) by mouth every 6 (six) hours as needed for moderate pain. (Patient taking differently: Take 50-100 mg by  mouth 3 (three) times daily as needed for moderate pain. ) 40 tablet 0 09/13/2019 at Unknown time  . aspirin 325 MG tablet Take 325 mg by mouth daily.   09/10/2019  . finasteride (PROSCAR) 5 MG tablet Take 1 tablet (5 mg total) by mouth daily. 90 tablet 3   . gabapentin (NEURONTIN) 300 MG capsule Take 600 mg by mouth 2 (two) times daily.        No Known Allergies   Past Medical History:  Diagnosis Date  . Anemia   . Anxiety   . Arthritis   . Asthma   . Coronary artery disease   . Diet-controlled type 2 diabetes mellitus ([place]) 08/27/2013  . Dyspnea   . Gout   . History of colon polyps   . Hyperlipidemia   . Hypertension   . Myocardial infarction ([place])   . Nasal septal defect   . 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 Polyarthropathy   . Pre-diabetes   . S/P trigger finger release    bilateral  . Sleep apnea   . Urinary outflow obstruction     Review of systems:  Otherwise negative.    Physical Exam  Gen: Alert, oriented. Appears stated age.  HEENT: [place]/AT. PERRLA. Lungs: CTA, no wheezes. CV: RR nl S1, S2. Abd: soft,  benign, no masses. BS+ Ext: No edema. Pulses 2+    Planned procedures: Proceed with colonoscopy. The patient understands the nature of the planned procedure, indications, risks, alternatives and potential complications including but not limited to bleeding, infection, perforation, damage to internal organs and possible oversedation/side effects from anesthesia. The patient agrees and gives consent to proceed.  Please refer to procedure notes for findings, recommendations and patient disposition/instructions.     Elnora Quizon K. Alice Reichert, M.D. Gastroenterology 09/13/2019  9:47 AM

## 2019-09-13 NOTE — Anesthesia Postprocedure Evaluation (Signed)
Anesthesia Post Note  Patient: Jared Freeman  Procedure(s) Performed: COLONOSCOPY WITH PROPOFOL (N/A )  Patient location during evaluation: Endoscopy Anesthesia Type: General Level of consciousness: awake and alert and oriented Pain management: pain level controlled Vital Signs Assessment: post-procedure vital signs reviewed and stable Respiratory status: spontaneous breathing, nonlabored ventilation and respiratory function stable Cardiovascular status: blood pressure returned to baseline and stable Postop Assessment: no signs of nausea or vomiting Anesthetic complications: no   No complications documented.   Last Vitals:  Vitals:   09/13/19 1020 09/13/19 1030  BP: (!) 93/64 96/66  Pulse: 79 80  Resp: 16 20  Temp:    SpO2: 97% 98%    Last Pain:  Vitals:   09/13/19 1010  TempSrc: Temporal  PainSc:                  Ben Habermann

## 2019-09-13 NOTE — Op Note (Signed)
[facility] Gastroenterology Patient Name: Jared Freeman Procedure Date: 09/13/2019 9:47 AM MRN: [id number] Account #: [id number] Date of Birth: Nov 20, 1940 Admit Type: Outpatient Age: 79 Room: [facility] ENDO ROOM 3 Gender: Male Note Status: Finalized Procedure:             Colonoscopy Indications:           Surveillance: Personal history of adenomatous polyps                         on last colonoscopy > 5 years ago Providers:             Lorie Apley K. Leaner Morici MD, MD Medicines:             Propofol per Anesthesia Complications:         No immediate complications. Procedure:             Pre-Anesthesia Assessment:                        - The risks and benefits of the procedure and the                         sedation options and risks were discussed with the                         patient. All questions were answered and informed                         consent was obtained.                        - Patient identification and proposed procedure were                         verified prior to the procedure by the nurse. The                         procedure was verified in the procedure room.                        - ASA Grade Assessment: III - A patient with severe                         systemic disease.                        - After reviewing the risks and benefits, the patient                         was deemed in satisfactory condition to undergo the                         procedure.                        After obtaining informed consent, the colonoscope was                         passed under direct vision. Throughout the procedure,  the patient's blood pressure, pulse, and oxygen                         saturations were monitored continuously. The                         Colonoscope was introduced through the anus and                         advanced to the the cecum, identified by appendiceal                         orifice and ileocecal  valve. The colonoscopy was                         performed without difficulty. The patient tolerated                         the procedure well. The quality of the bowel                         preparation was good. The ileocecal valve, appendiceal                         orifice, and rectum were photographed. Findings:      The perianal and digital rectal examinations were normal. Pertinent       negatives include normal sphincter tone and no palpable rectal lesions.      Non-bleeding internal hemorrhoids were found during retroflexion. The       hemorrhoids were Grade I (internal hemorrhoids that do not prolapse).      Multiple small and large-mouthed diverticula were found in the left       colon. There was no evidence of diverticular bleeding.      The exam was otherwise without abnormality. Impression:            - Non-bleeding internal hemorrhoids.                        - Mild diverticulosis in the left colon. There was no                         evidence of diverticular bleeding.                        - The examination was otherwise normal.                        - No specimens collected. Recommendation:        - Patient has a contact number available for                         emergencies. The signs and symptoms of potential                         delayed complications were discussed with the patient.                         Return to normal activities tomorrow. Written  discharge instructions were provided to the patient.                        - Resume previous diet.                        - Continue present medications.                        - No repeat colonoscopy due to age and the absence of                         colonic polyps.                        - You do NOT require further colon cancer screening                         measures (Annual stool testing (i.e. hemoccult, FIT,                         cologuard), sigmoidoscopy, colonoscopy or  CT                         colonography). You should share this recommendation                         with your Primary Care provider.                        - Return to GI office PRN.                        - The findings and recommendations were discussed with                         the patient. Procedure Code(s):     --- Professional ---                        I9518, Colorectal cancer screening; colonoscopy on                         individual at high risk Diagnosis Code(s):     --- Professional ---                        K57.30, Diverticulosis of large intestine without                         perforation or abscess without bleeding                        K64.0, First degree hemorrhoids                        Z86.010, Personal history of colonic polyps CPT copyright 2019 American Medical Association. All rights reserved. The codes documented in this report are preliminary and upon coder review may  be revised to meet current compliance requirements. Efrain Sella MD, MD 09/13/2019 10:12:06 AM This report has been signed electronically. Number of Addenda: 0 Note Initiated On: 09/13/2019  9:47 AM Scope Withdrawal Time: 0 hours 6 minutes 4 seconds  Total Procedure Duration: 0 hours 8 minutes 26 seconds  Estimated Blood Loss:  Estimated blood loss: none.      [facility]

## 2019-09-13 NOTE — Interval H&P Note (Signed)
History and Physical Interval Note:  09/13/2019 9:47 AM  Jared Freeman  has presented today for surgery, with the diagnosis of HX COLON POLYPS.  The various methods of treatment have been discussed with the patient and family. After consideration of risks, benefits and other options for treatment, the patient has consented to  Procedure(s): COLONOSCOPY WITH PROPOFOL (N/A) as a surgical intervention.  The patient's history has been reviewed, patient examined, no change in status, stable for surgery.  I have reviewed the patient's chart and labs.  Questions were answered to the patient's satisfaction.     [place], [place]

## 2019-09-20 NOTE — Progress Notes (Signed)
09/21/2019 12:50 PM   Jared Freeman 14-Oct-1940 [id number]  Referring provider: Idelle Crouch, MD [facility] [place],  [place] [address] Chief Complaint  Patient presents with  . Elevated PSA    HPI: Jared Freeman is a 79 y.o. male with a history of BPH with LUTS, penile cancer, ED and elevated PSA who returns for an annual exam.   Patient has had regular symptomatic check ups with Debroah Loop, PA-C and was last seen on 08/02/2019. (see notes for details).  Patient is on finasteride and tamsulosin and caffeine restriction.  Most recent PSA 1.86 as of 09/07/2019  Today he notes frequency.  He is no longer having significant nocturia.  He continues to drink coffee especially in the mornings and does not believe he is cut back as much as he had previously.  Although his PVR is significantly elevated today, he does feel he is emptying his bladder for the most part.    IPSS    Row Name 09/21/19 1000         International Prostate Symptom Score   How often have you had the sensation of not emptying your bladder? About half the time     How often have you had to urinate less than every two hours? Less than half the time     How often have you found you stopped and started again several times when you urinated? Less than half the time     How often have you found it difficult to postpone urination? About half the time     How often have you had a weak urinary stream? Less than half the time     How often have you had to strain to start urination? Not at All     How many times did you typically get up at night to urinate? 1 Time     Total IPSS Score 13       Quality of Life due to urinary symptoms   If you were to spend the rest of your life with your urinary condition just the way it is now how would you feel about that? Mostly Satisfied            Score:  1-7 Mild 8-19 Moderate 20-35 Severe    PMH: Past Medical  History:  Diagnosis Date  . Anemia   . Anxiety   . Arthritis   . Asthma   . Coronary artery disease   . Diet-controlled type 2 diabetes mellitus ([place]) 08/27/2013  . Dyspnea   . Gout   . History of colon polyps   . Hyperlipidemia   . Hypertension   . Myocardial infarction ([place])   . Nasal septal defect   . Penile cancer ([place]) 2014  . Polyarthropathy   . Pre-diabetes   . S/P trigger finger release    bilateral  . Sleep apnea   . Urinary outflow obstruction     Surgical History: Past Surgical History:  Procedure Laterality Date  . APPENDECTOMY    . CARDIAC CATHETERIZATION     with stent  . CARPAL TUNNEL RELEASE Bilateral   . COLONOSCOPY    . COLONOSCOPY WITH PROPOFOL N/A 10/03/2014   Procedure: COLONOSCOPY WITH PROPOFOL;  Surgeon: Manya Silvas, MD;  Location[facility] ENDOSCOPY;  Service: Endoscopy;  Laterality: N/A;  . COLONOSCOPY WITH PROPOFOL N/A 09/13/2019   Procedure: COLONOSCOPY WITH PROPOFOL;  Surgeon: [place], Benay Pike, MD;  Location: [facility] ENDOSCOPY;  Service:  Gastroenterology;  Laterality: N/A;  . ESOPHAGOGASTRODUODENOSCOPY    . GANGLION CYST EXCISION    . PENILE BIOPSY  2014   Squamous Cell Carcinoma  . REVERSE SHOULDER ARTHROPLASTY Left 11/11/2017   Procedure: REVERSE SHOULDER ARTHROPLASTY;  Surgeon: Corky Mull, MD;  Location: [facility] ORS;  Service: Orthopedics;  Laterality: Left;  . REVERSE SHOULDER ARTHROPLASTY Right 02/19/2018   Procedure: REVERSE SHOULDER ARTHROPLASTY;  Surgeon: Corky Mull, MD;  Location: [facility] ORS;  Service: Orthopedics;  Laterality: Right;  . ROTATOR CUFF REPAIR    . TONSILLECTOMY      Home Medications:  Allergies as of 09/21/2019   No Known Allergies     Medication List       Accurate as of September 21, 2019 12:50 PM. If you have any questions, ask your nurse or doctor.        STOP taking these medications   gabapentin 300 MG capsule Commonly known as: NEURONTIN Stopped by: Hollice Espy, MD     TAKE these medications     acetaminophen 500 MG tablet Commonly known as: TYLENOL Take 1,000 mg by mouth every 6 (six) hours as needed for moderate pain.   allopurinol 300 MG tablet Commonly known as: ZYLOPRIM Take 300 mg by mouth daily.   amitriptyline 25 MG tablet Commonly known as: ELAVIL TAKE 1 TABLET BY MOUTH EVERY DAY AT NIGHT   amLODipine-benazepril 5-20 MG capsule Commonly known as: LOTREL Take 1 capsule by mouth daily.   aspirin 325 MG tablet Take 325 mg by mouth daily.   escitalopram 10 MG tablet Commonly known as: LEXAPRO TAKE 1 TABLET BY MOUTH EVERY DAY   finasteride 5 MG tablet Commonly known as: PROSCAR Take 1 tablet (5 mg total) by mouth daily.   hydroxychloroquine 200 MG tablet Commonly known as: PLAQUENIL Take 200 mg by mouth daily.   leflunomide 10 MG tablet Commonly known as: ARAVA Take 10 mg by mouth daily.   montelukast 10 MG tablet Commonly known as: SINGULAIR TAKE 1 TABLET BY MOUTH EVERY DAY AT NIGHT   rosuvastatin 20 MG tablet Commonly known as: CRESTOR Take 20 mg by mouth daily.   tamsulosin 0.4 MG Caps capsule Commonly known as: Flomax Take 2 capsules (0.8 mg total) by mouth daily.   traMADol 50 MG tablet Commonly known as: ULTRAM Take 1-2 tablets (50-100 mg total) by mouth every 6 (six) hours as needed for moderate pain. What changed: when to take this       Allergies: No Known Allergies  Family History: Family History  Problem Relation Age of Onset  . Stroke Mother   . Heart attack Father     Social History:  reports that he quit smoking about 26 years ago. His smoking use included cigarettes. He quit after 15.00 years of use. He has never used smokeless tobacco. He reports that he does not drink alcohol and does not use drugs.   Physical Exam: BP 132/75   Pulse 88   Ht 5\' 5"  (1.651 m)   Wt 192 lb (87.1 kg)   BMI 31.95 kg/m   Constitutional:  Alert and oriented, No acute distress. HEENT: [place] AT, moist mucus membranes.  Trachea midline, no  masses. Cardiovascular: No clubbing, cyanosis, or edema. Respiratory: Normal respiratory effort, no increased work of breathing. Skin: No rashes, bruises or suspicious lesions. Neurologic: Grossly intact, no focal deficits, moving all 4 extremities. Psychiatric: Normal mood and affect.   Pertinent Imaging: Results for orders placed or performed in visit on 09/21/19  Bladder Scan (Post Void Residual) in office  Result Value Ref Range   Scan Result 274      Assessment & Plan:    1. Elevated PSA Most recent PSA 1.86 as of 09/07/2019. PSA is within normal limits, will continue annual PSA since he is on finasteride.  Defer rectal exam given his age  67. Incomplete Bladder emptying  Symptoms are worsening, there is concern patient may develop retention. Will not place patient on any anticholingeric's.   IPSS score 13, moderate. PVR is 274 mL.  We discussed the consideration of outlet outlet procedure given that his PVR continues to climb. We discussed alternatives including TURP, PVP, UroLift and holmium laser enucleation of the prostate for large glands.  Minimally invasive options of UroLift and water vapor ablation were discussed. Differences between the surgical procedures were discussed as well as the risks and benefits of each.    Will schedule cystoscopy and TRUS prostate for anatomic evaluation. All questions were answered and he desires to proceed  Ultimately, is not sure if he would like to proceed with anything but would be interested in pursuing this work-up in case he decides to have it down the road   3. BPH with LUTS Continue finasteride and tamsulosin and caffeine restriction.  4. PMH penile cancer Followed by his dermatologist Dr. Ishmael Holter.   [facility] [address] [place], [place] [address] [phone]  I, Selena Batten, am acting as a scribe for Dr. Hollice Espy.  I have reviewed the above documentation  for accuracy and completeness, and I agree with the above.   Hollice Espy, MD

## 2019-09-21 ENCOUNTER — Other Ambulatory Visit: Payer: Self-pay

## 2019-09-21 ENCOUNTER — Ambulatory Visit (INDEPENDENT_AMBULATORY_CARE_PROVIDER_SITE_OTHER): Payer: Medicare Other | Admitting: Urology

## 2019-09-21 VITALS — BP 132/75 | HR 88 | Ht 65.0 in | Wt 192.0 lb

## 2019-09-21 DIAGNOSIS — R3915 Urgency of urination: Secondary | ICD-10-CM | POA: Diagnosis not present

## 2019-09-21 DIAGNOSIS — N401 Enlarged prostate with lower urinary tract symptoms: Secondary | ICD-10-CM | POA: Diagnosis not present

## 2019-09-21 LAB — BLADDER SCAN AMB NON-IMAGING: Scan Result: 274

## 2019-10-09 ENCOUNTER — Encounter: Payer: Self-pay | Admitting: Emergency Medicine

## 2019-10-09 ENCOUNTER — Inpatient Hospital Stay
Admission: EM | Admit: 2019-10-09 | Discharge: 2019-10-13 | DRG: 291 | Disposition: A | Discharge status: Home Health | Payer: Medicare Other | Attending: Family Medicine | Admitting: Family Medicine

## 2019-10-09 ENCOUNTER — Other Ambulatory Visit: Payer: Self-pay

## 2019-10-09 DIAGNOSIS — I2510 Atherosclerotic heart disease of native coronary artery without angina pectoris: Secondary | ICD-10-CM | POA: Diagnosis present

## 2019-10-09 DIAGNOSIS — R296 Repeated falls: Secondary | ICD-10-CM | POA: Diagnosis present

## 2019-10-09 DIAGNOSIS — Z823 Family history of stroke: Secondary | ICD-10-CM

## 2019-10-09 DIAGNOSIS — M069 Rheumatoid arthritis, unspecified: Secondary | ICD-10-CM | POA: Diagnosis present

## 2019-10-09 DIAGNOSIS — Z79899 Other long term (current) drug therapy: Secondary | ICD-10-CM

## 2019-10-09 DIAGNOSIS — Z87891 Personal history of nicotine dependence: Secondary | ICD-10-CM

## 2019-10-09 DIAGNOSIS — I130 Hypertensive heart and chronic kidney disease with heart failure and stage 1 through stage 4 chronic kidney disease, or unspecified chronic kidney disease: Secondary | ICD-10-CM | POA: Diagnosis not present

## 2019-10-09 DIAGNOSIS — Y92481 Parking lot as the place of occurrence of the external cause: Secondary | ICD-10-CM

## 2019-10-09 DIAGNOSIS — Z96612 Presence of left artificial shoulder joint: Secondary | ICD-10-CM | POA: Diagnosis present

## 2019-10-09 DIAGNOSIS — I714 Abdominal aortic aneurysm, without rupture, unspecified: Secondary | ICD-10-CM

## 2019-10-09 DIAGNOSIS — Z96611 Presence of right artificial shoulder joint: Secondary | ICD-10-CM | POA: Diagnosis present

## 2019-10-09 DIAGNOSIS — N1831 Chronic kidney disease, stage 3a: Secondary | ICD-10-CM | POA: Diagnosis present

## 2019-10-09 DIAGNOSIS — F5104 Psychophysiologic insomnia: Secondary | ICD-10-CM | POA: Diagnosis present

## 2019-10-09 DIAGNOSIS — Z8719 Personal history of other diseases of the digestive system: Secondary | ICD-10-CM

## 2019-10-09 DIAGNOSIS — Z8549 Personal history of malignant neoplasm of other male genital organs: Secondary | ICD-10-CM

## 2019-10-09 DIAGNOSIS — E1122 Type 2 diabetes mellitus with diabetic chronic kidney disease: Secondary | ICD-10-CM | POA: Diagnosis present

## 2019-10-09 DIAGNOSIS — E119 Type 2 diabetes mellitus without complications: Secondary | ICD-10-CM | POA: Diagnosis present

## 2019-10-09 DIAGNOSIS — W1830XA Fall on same level, unspecified, initial encounter: Secondary | ICD-10-CM | POA: Diagnosis present

## 2019-10-09 DIAGNOSIS — S065XAA Traumatic subdural hemorrhage with loss of consciousness status unknown, initial encounter: Secondary | ICD-10-CM

## 2019-10-09 DIAGNOSIS — I5033 Acute on chronic diastolic (congestive) heart failure: Secondary | ICD-10-CM | POA: Diagnosis present

## 2019-10-09 DIAGNOSIS — J9601 Acute respiratory failure with hypoxia: Secondary | ICD-10-CM | POA: Diagnosis present

## 2019-10-09 DIAGNOSIS — N400 Enlarged prostate without lower urinary tract symptoms: Secondary | ICD-10-CM | POA: Diagnosis present

## 2019-10-09 DIAGNOSIS — I252 Old myocardial infarction: Secondary | ICD-10-CM

## 2019-10-09 DIAGNOSIS — Z20822 Contact with and (suspected) exposure to covid-19: Secondary | ICD-10-CM | POA: Diagnosis present

## 2019-10-09 DIAGNOSIS — I444 Left anterior fascicular block: Secondary | ICD-10-CM | POA: Diagnosis present

## 2019-10-09 DIAGNOSIS — M542 Cervicalgia: Secondary | ICD-10-CM | POA: Diagnosis present

## 2019-10-09 DIAGNOSIS — Z8249 Family history of ischemic heart disease and other diseases of the circulatory system: Secondary | ICD-10-CM

## 2019-10-09 DIAGNOSIS — S065X9A Traumatic subdural hemorrhage with loss of consciousness of unspecified duration, initial encounter: Secondary | ICD-10-CM | POA: Diagnosis present

## 2019-10-09 DIAGNOSIS — E785 Hyperlipidemia, unspecified: Secondary | ICD-10-CM | POA: Diagnosis present

## 2019-10-09 DIAGNOSIS — R52 Pain, unspecified: Secondary | ICD-10-CM

## 2019-10-09 DIAGNOSIS — Z7982 Long term (current) use of aspirin: Secondary | ICD-10-CM

## 2019-10-09 DIAGNOSIS — M47812 Spondylosis without myelopathy or radiculopathy, cervical region: Secondary | ICD-10-CM | POA: Diagnosis present

## 2019-10-09 DIAGNOSIS — J441 Chronic obstructive pulmonary disease with (acute) exacerbation: Secondary | ICD-10-CM | POA: Diagnosis present

## 2019-10-09 DIAGNOSIS — F419 Anxiety disorder, unspecified: Secondary | ICD-10-CM | POA: Diagnosis present

## 2019-10-09 DIAGNOSIS — M109 Gout, unspecified: Secondary | ICD-10-CM | POA: Diagnosis present

## 2019-10-09 NOTE — ED Triage Notes (Signed)
Pt arrives with [facility] for neck pain since 1600. Pt does report fall in [place] clinic parking lot x 2 days ago but does not believe the two are related. Pt is having extreme left sided neck pain at this time. Pt reports taking tylenol x 4 tablets and tramadol x 2 at home without relief.

## 2019-10-10 ENCOUNTER — Emergency Department: Payer: Medicare Other

## 2019-10-10 ENCOUNTER — Encounter: Payer: Self-pay | Admitting: Radiology

## 2019-10-10 ENCOUNTER — Observation Stay: Payer: Medicare Other

## 2019-10-10 DIAGNOSIS — M542 Cervicalgia: Secondary | ICD-10-CM | POA: Diagnosis not present

## 2019-10-10 DIAGNOSIS — S161XXA Strain of muscle, fascia and tendon at neck level, initial encounter: Secondary | ICD-10-CM

## 2019-10-10 DIAGNOSIS — N1831 Chronic kidney disease, stage 3a: Secondary | ICD-10-CM

## 2019-10-10 DIAGNOSIS — R296 Repeated falls: Secondary | ICD-10-CM

## 2019-10-10 DIAGNOSIS — E785 Hyperlipidemia, unspecified: Secondary | ICD-10-CM | POA: Diagnosis not present

## 2019-10-10 DIAGNOSIS — I10 Essential (primary) hypertension: Secondary | ICD-10-CM

## 2019-10-10 LAB — CBC WITH DIFFERENTIAL/PLATELET
Abs Immature Granulocytes: 0.04 10*3/uL (ref 0.00–0.07)
Basophils Absolute: 0 10*3/uL (ref 0.0–0.1)
Basophils Relative: 0 %
Eosinophils Absolute: 0.1 10*3/uL (ref 0.0–0.5)
Eosinophils Relative: 1 %
HCT: 40.7 % (ref 39.0–52.0)
Hemoglobin: 13.9 g/dL (ref 13.0–17.0)
Immature Granulocytes: 0 %
Lymphocytes Relative: 10 %
Lymphs Abs: 1 10*3/uL (ref 0.7–4.0)
MCH: 30.8 pg (ref 26.0–34.0)
MCHC: 34.2 g/dL (ref 30.0–36.0)
MCV: 90 fL (ref 80.0–100.0)
Monocytes Absolute: 0.5 10*3/uL (ref 0.1–1.0)
Monocytes Relative: 5 %
Neutro Abs: 8.9 10*3/uL — ABNORMAL HIGH (ref 1.7–7.7)
Neutrophils Relative %: 84 %
Platelets: 196 10*3/uL (ref 150–400)
RBC: 4.52 MIL/uL (ref 4.22–5.81)
RDW: 14.3 % (ref 11.5–15.5)
WBC: 10.6 10*3/uL — ABNORMAL HIGH (ref 4.0–10.5)
nRBC: 0 % (ref 0.0–0.2)

## 2019-10-10 LAB — BASIC METABOLIC PANEL
Anion gap: 11 (ref 5–15)
Anion gap: 9 (ref 5–15)
BUN: 17 mg/dL (ref 8–23)
BUN: 20 mg/dL (ref 8–23)
CO2: 22 mmol/L (ref 22–32)
CO2: 23 mmol/L (ref 22–32)
Calcium: 8 mg/dL — ABNORMAL LOW (ref 8.9–10.3)
Calcium: 9 mg/dL (ref 8.9–10.3)
Chloride: 105 mmol/L (ref 98–111)
Chloride: 105 mmol/L (ref 98–111)
Creatinine, Ser: 1.33 mg/dL — ABNORMAL HIGH (ref 0.61–1.24)
Creatinine, Ser: 1.43 mg/dL — ABNORMAL HIGH (ref 0.61–1.24)
GFR calc Af Amer: 54 mL/min — ABNORMAL LOW (ref >60)
GFR calc Af Amer: 59 mL/min — ABNORMAL LOW (ref >60)
GFR calc non Af Amer: 47 mL/min — ABNORMAL LOW (ref >60)
GFR calc non Af Amer: 51 mL/min — ABNORMAL LOW (ref >60)
Glucose, Bld: 194 mg/dL — ABNORMAL HIGH (ref 70–99)
Glucose, Bld: 209 mg/dL — ABNORMAL HIGH (ref 70–99)
Potassium: 4.1 mmol/L (ref 3.5–5.1)
Potassium: 4.1 mmol/L (ref 3.5–5.1)
Sodium: 137 mmol/L (ref 135–145)
Sodium: 138 mmol/L (ref 135–145)

## 2019-10-10 LAB — CBC
HCT: 37.6 % — ABNORMAL LOW (ref 39.0–52.0)
Hemoglobin: 12.1 g/dL — ABNORMAL LOW (ref 13.0–17.0)
MCH: 30 pg (ref 26.0–34.0)
MCHC: 32.2 g/dL (ref 30.0–36.0)
MCV: 93.1 fL (ref 80.0–100.0)
Platelets: 167 10*3/uL (ref 150–400)
RBC: 4.04 MIL/uL — ABNORMAL LOW (ref 4.22–5.81)
RDW: 14 % (ref 11.5–15.5)
WBC: 15.4 10*3/uL — ABNORMAL HIGH (ref 4.0–10.5)
nRBC: 0 % (ref 0.0–0.2)

## 2019-10-10 LAB — SARS CORONAVIRUS 2 BY RT PCR (HOSPITAL ORDER, PERFORMED IN ~~LOC~~ HOSPITAL LAB): SARS Coronavirus 2: NEGATIVE

## 2019-10-10 LAB — GLUCOSE, CAPILLARY
Glucose-Capillary: 166 mg/dL — ABNORMAL HIGH (ref 70–99)
Glucose-Capillary: 160 mg/dL — ABNORMAL HIGH (ref 70–99)

## 2019-10-10 LAB — HEMOGLOBIN A1C
Hgb A1c MFr Bld: 6.6 % — ABNORMAL HIGH (ref 4.8–5.6)
Mean Plasma Glucose: 142.72 mg/dL

## 2019-10-10 MED ORDER — ESCITALOPRAM OXALATE 10 MG PO TABS
10.0000 mg | ORAL_TABLET | Freq: Every day | ORAL | Status: DC
  Administered 2019-10-10 – 2019-10-13 (×4): 10 mg via ORAL
  Filled 2019-10-10 (×4): qty 1

## 2019-10-10 MED ORDER — ACETAMINOPHEN 650 MG RE SUPP: 650.0000 mg | Freq: Four times a day (QID) | RECTAL | Status: DC | PRN

## 2019-10-10 MED ORDER — MORPHINE SULFATE (PF) 4 MG/ML IV SOLN
4.0000 mg | Freq: Once | INTRAVENOUS | Status: AC
  Administered 2019-10-10: 4 mg via INTRAVENOUS
  Filled 2019-10-10: qty 1

## 2019-10-10 MED ORDER — SODIUM CHLORIDE 0.9 % IV BOLUS
1000.0000 mL | Freq: Once | INTRAVENOUS | Status: AC
  Administered 2019-10-10: 1000 mL via INTRAVENOUS

## 2019-10-10 MED ORDER — CYCLOBENZAPRINE HCL 10 MG PO TABS
10.0000 mg | ORAL_TABLET | Freq: Three times a day (TID) | ORAL | Status: DC | PRN
  Administered 2019-10-11 – 2019-10-12 (×3): 10 mg via ORAL
  Filled 2019-10-10 (×3): qty 1

## 2019-10-10 MED ORDER — HYDROMORPHONE HCL 1 MG/ML IJ SOLN
0.5000 mg | Freq: Once | INTRAMUSCULAR | Status: AC
  Administered 2019-10-10: 0.5 mg via INTRAVENOUS
  Filled 2019-10-10: qty 1

## 2019-10-10 MED ORDER — TAMSULOSIN HCL 0.4 MG PO CAPS
0.8000 mg | ORAL_CAPSULE | Freq: Every day | ORAL | Status: DC
  Administered 2019-10-10 – 2019-10-13 (×4): 0.8 mg via ORAL
  Filled 2019-10-10 (×4): qty 2

## 2019-10-10 MED ORDER — KETOROLAC TROMETHAMINE 30 MG/ML IJ SOLN
15.0000 mg | Freq: Four times a day (QID) | INTRAMUSCULAR | Status: DC | PRN
  Administered 2019-10-10: 15 mg via INTRAVENOUS
  Filled 2019-10-10: qty 1

## 2019-10-10 MED ORDER — AMLODIPINE BESYLATE 5 MG PO TABS
5.0000 mg | ORAL_TABLET | Freq: Every day | ORAL | Status: DC
  Administered 2019-10-10 – 2019-10-13 (×4): 5 mg via ORAL
  Filled 2019-10-10 (×4): qty 1

## 2019-10-10 MED ORDER — INSULIN ASPART 100 UNIT/ML ~~LOC~~ SOLN: 0.0000 [IU] | Freq: Every day | SUBCUTANEOUS | Status: DC

## 2019-10-10 MED ORDER — ALBUTEROL SULFATE (2.5 MG/3ML) 0.083% IN NEBU
2.5000 mg | INHALATION_SOLUTION | Freq: Four times a day (QID) | RESPIRATORY_TRACT | Status: DC | PRN
  Administered 2019-10-10 – 2019-10-11 (×2): 2.5 mg via RESPIRATORY_TRACT
  Filled 2019-10-10 (×2): qty 3

## 2019-10-10 MED ORDER — LEFLUNOMIDE 20 MG PO TABS
10.0000 mg | ORAL_TABLET | Freq: Every day | ORAL | Status: DC
  Administered 2019-10-10 – 2019-10-13 (×4): 10 mg via ORAL
  Filled 2019-10-10 (×4): qty 0.5

## 2019-10-10 MED ORDER — KETOROLAC TROMETHAMINE 30 MG/ML IJ SOLN
10.0000 mg | Freq: Once | INTRAMUSCULAR | Status: AC
  Administered 2019-10-10: 9.9 mg via INTRAVENOUS
  Filled 2019-10-10: qty 1

## 2019-10-10 MED ORDER — SODIUM CHLORIDE 0.9 % IV SOLN: INTRAVENOUS | Status: DC

## 2019-10-10 MED ORDER — HYDROMORPHONE HCL 1 MG/ML IJ SOLN
0.5000 mg | INTRAMUSCULAR | Status: DC | PRN
  Administered 2019-10-10: 1 mg via INTRAVENOUS
  Administered 2019-10-10 (×2): 0.5 mg via INTRAVENOUS
  Administered 2019-10-11 (×2): 1 mg via INTRAVENOUS
  Filled 2019-10-10 (×5): qty 1

## 2019-10-10 MED ORDER — ONDANSETRON HCL 4 MG/2ML IJ SOLN: 4.0000 mg | Freq: Four times a day (QID) | INTRAMUSCULAR | Status: DC | PRN

## 2019-10-10 MED ORDER — IOHEXOL 350 MG/ML SOLN
125.0000 mL | Freq: Once | INTRAVENOUS | Status: AC | PRN
  Administered 2019-10-10: 125 mL via INTRAVENOUS

## 2019-10-10 MED ORDER — TRAMADOL HCL 50 MG PO TABS: 50.0000 mg | ORAL_TABLET | Freq: Three times a day (TID) | ORAL | Status: DC | PRN

## 2019-10-10 MED ORDER — BENAZEPRIL HCL 20 MG PO TABS
20.0000 mg | ORAL_TABLET | Freq: Every day | ORAL | Status: DC
  Administered 2019-10-10: 20 mg via ORAL
  Filled 2019-10-10: qty 1

## 2019-10-10 MED ORDER — ENOXAPARIN SODIUM 40 MG/0.4ML ~~LOC~~ SOLN
40.0000 mg | SUBCUTANEOUS | Status: DC
  Administered 2019-10-11: 40 mg via SUBCUTANEOUS
  Filled 2019-10-10: qty 0.4

## 2019-10-10 MED ORDER — MIDAZOLAM HCL 2 MG/2ML IJ SOLN
1.0000 mg | Freq: Once | INTRAMUSCULAR | Status: AC
  Administered 2019-10-10: 1 mg via INTRAVENOUS
  Filled 2019-10-10: qty 2

## 2019-10-10 MED ORDER — INSULIN ASPART 100 UNIT/ML ~~LOC~~ SOLN
0.0000 [IU] | Freq: Three times a day (TID) | SUBCUTANEOUS | Status: DC
  Administered 2019-10-10 – 2019-10-12 (×4): 3 [IU] via SUBCUTANEOUS
  Administered 2019-10-12 – 2019-10-13 (×2): 2 [IU] via SUBCUTANEOUS
  Administered 2019-10-13: 5 [IU] via SUBCUTANEOUS
  Filled 2019-10-10 (×7): qty 1

## 2019-10-10 MED ORDER — ACETAMINOPHEN 325 MG PO TABS
650.0000 mg | ORAL_TABLET | Freq: Four times a day (QID) | ORAL | Status: DC | PRN
  Administered 2019-10-11 – 2019-10-13 (×4): 650 mg via ORAL
  Filled 2019-10-10 (×4): qty 2

## 2019-10-10 MED ORDER — AMLODIPINE BESY-BENAZEPRIL HCL 5-20 MG PO CAPS: 1.0000 | ORAL_CAPSULE | Freq: Every day | ORAL | Status: DC

## 2019-10-10 MED ORDER — ONDANSETRON HCL 4 MG/2ML IJ SOLN
4.0000 mg | Freq: Once | INTRAMUSCULAR | Status: AC
  Administered 2019-10-10: 4 mg via INTRAVENOUS
  Filled 2019-10-10: qty 2

## 2019-10-10 MED ORDER — CYCLOBENZAPRINE HCL 10 MG PO TABS
10.0000 mg | ORAL_TABLET | Freq: Once | ORAL | Status: AC
  Administered 2019-10-10: 10 mg via ORAL
  Filled 2019-10-10: qty 1

## 2019-10-10 MED ORDER — OXYCODONE HCL 5 MG PO TABS
5.0000 mg | ORAL_TABLET | ORAL | Status: DC | PRN
  Administered 2019-10-10 – 2019-10-11 (×5): 5 mg via ORAL
  Filled 2019-10-10 (×5): qty 1

## 2019-10-10 MED ORDER — ROSUVASTATIN CALCIUM 10 MG PO TABS
20.0000 mg | ORAL_TABLET | Freq: Every day | ORAL | Status: DC
  Administered 2019-10-11 – 2019-10-12 (×2): 20 mg via ORAL
  Filled 2019-10-10: qty 2
  Filled 2019-10-10: qty 1
  Filled 2019-10-10: qty 2

## 2019-10-10 MED ORDER — BUDESONIDE 0.25 MG/2ML IN SUSP
2.0000 mL | Freq: Two times a day (BID) | RESPIRATORY_TRACT | Status: DC
  Administered 2019-10-10 – 2019-10-11 (×3): 0.25 mg via RESPIRATORY_TRACT
  Filled 2019-10-10 (×3): qty 2

## 2019-10-10 MED ORDER — HYDROXYCHLOROQUINE SULFATE 200 MG PO TABS
200.0000 mg | ORAL_TABLET | Freq: Every day | ORAL | Status: DC
  Administered 2019-10-10 – 2019-10-13 (×4): 200 mg via ORAL
  Filled 2019-10-10 (×4): qty 1

## 2019-10-10 MED ORDER — ALLOPURINOL 100 MG PO TABS
300.0000 mg | ORAL_TABLET | Freq: Every day | ORAL | Status: DC
  Administered 2019-10-10 – 2019-10-13 (×4): 300 mg via ORAL
  Filled 2019-10-10: qty 1
  Filled 2019-10-10 (×3): qty 3

## 2019-10-10 MED ORDER — MAGNESIUM HYDROXIDE 400 MG/5ML PO SUSP: 30.0000 mL | Freq: Every day | ORAL | Status: DC | PRN

## 2019-10-10 MED ORDER — FINASTERIDE 5 MG PO TABS
5.0000 mg | ORAL_TABLET | Freq: Every day | ORAL | Status: DC
  Administered 2019-10-10 – 2019-10-13 (×4): 5 mg via ORAL
  Filled 2019-10-10 (×4): qty 1

## 2019-10-10 MED ORDER — MONTELUKAST SODIUM 10 MG PO TABS
10.0000 mg | ORAL_TABLET | Freq: Every day | ORAL | Status: DC
  Administered 2019-10-10 – 2019-10-12 (×3): 10 mg via ORAL
  Filled 2019-10-10 (×4): qty 1

## 2019-10-10 MED ORDER — ASPIRIN EC 325 MG PO TBEC
325.0000 mg | DELAYED_RELEASE_TABLET | Freq: Every day | ORAL | Status: DC
  Administered 2019-10-10 – 2019-10-11 (×2): 325 mg via ORAL
  Filled 2019-10-10 (×2): qty 1

## 2019-10-10 MED ORDER — ONDANSETRON HCL 4 MG PO TABS: 4.0000 mg | ORAL_TABLET | Freq: Four times a day (QID) | ORAL | Status: DC | PRN

## 2019-10-10 NOTE — Progress Notes (Signed)
Patient admitted to the hospital earlier this morning by Dr. Sidney Ace.  Patient seen and examined.  Continues to have discomfort in his neck with difficulty moving his head.  Denies any shortness of breath at this time.  Lungs are clear to auscultation bilaterally.  He is mildly tachycardic.  No lower extremity edema.  Assessment/plan.  1. Significant left neck pain status post fall.  Patient admitted for further pain management.  CT of the C-spine did not show any acute changes, did comment on spondylosis.  With continuing symptoms despite receiving pain medications, will check MRI C-spine for further evaluation.  Continue Flexeril, narcotic medications as needed, apply heat pad to left neck. 2. Recurrent falls.  Patient wife does describe patient having multiple falls over the past few weeks.  Physical therapy evaluation has been requested.  He normally ambulates independently, although she feels that he probably should be ambulating with a walker. 3. Chronic kidney disease stage IIIa.  Creatinine appears to be near baseline.  Continue to monitor.  Will hold off on further Toradol or benazepril at this time. 4. Hypertension.  Continue on amlodipine.  Hold further benazepril in light of CKD. 5. Gout.  Continue on allopurinol.  No evidence of flare at this time. 6. BPH.  Continue on Flomax and finasteride.  He is followed by urology. 7. Rheumatoid arthritis.  Continue on Plaquenil and Arava. 8. COPD.  Continue on inhaled steroids and bronchodilators as needed. 9. Diabetes.  Diet controlled.  Start on sliding scale insulin.  [employer]

## 2019-10-10 NOTE — H&P (Signed)
[place]   PATIENT NAME: Jared Freeman    MR#:  [id number]  DATE OF BIRTH:  June 09, 1940  DATE OF ADMISSION:  10/09/2019  PRIMARY CARE PHYSICIAN: Idelle Crouch, MD   REQUESTING/REFERRING PHYSICIAN: Cora Collum, MD CHIEF COMPLAINT:   Chief Complaint  Patient presents with  . Neck Pain    HISTORY OF PRESENT ILLNESS:  Jared Freeman  is a 79 y.o. Caucasian male with a known history of asthma, coronary artery disease, type 2 diabetes mellitus, gout, hypertension and dyslipidemia who presented to the emergency room with acute onset of neck pain mainly on the left side.  He had an accidental mechanical fall 2 days ago at [place] [facility] and his wife believes that he may have twisted his neck.  He stated that he fell on the right hand and hurt his right chest wall.  He denied any head injuries paresthesias or focal muscle weakness during or after the fall.  No headache or dizziness or blurred vision or presyncope.  No nausea or vomiting or abdominal pain.  His wife stated that he has been having recurrent falls throughout the summer due to loss of balance.  He denies chest pain or palpitations.  No cough or wheezing or hemoptysis.  He has been vaccinated for COVID-19.  When he came to the ER, blood pressure was 153/119 later 175/102 with a heart rate of 113 respiratory rate of 23.  Labs revealed a creatinine of 1.43 with glucose 194 and CBC showed WBC of 10.6.  COVID-19 PCR came back negative.  EKG showed sinus tachycardia with rate of 114 with prolonged PR interval and left anterior fascicular block with poor R wave progression.Marland Kitchen C-spine CT showed no definite acute fracture and straightening of the cervical spine with severe degenerative changes from C4-C7 including marked irregular facet arthropathy on the left side at C3-C4.  CTA of the neck came back negative with no evidence for traumatic vascular injury to the major arterial vasculature of the neck.  It showed atheromatous plaque about  the carotid bifurcation/proximal ICAs bilaterally with associated stenosis of up to 40% on the right and mid 25% on the left.  It showed severe multilevel cervical spondylosis and facet arthrosis.  The patient was given 1 mg of IV Versed, 4 mg of IV morphine sulfate 10 mg IV Toradol and 4 mg IV Zofran as well as 2 L bolus of IV normal saline.  He will be admitted to a an observation medical monitored bed for further evaluation and management.   PAST MEDICAL HISTORY:   Past Medical History:  Diagnosis Date  . Anemia   . Anxiety   . Arthritis   . Asthma   . Coronary artery disease   . Diet-controlled type 2 diabetes mellitus ([place]) 08/27/2013  . Dyspnea   . Gout   . History of colon polyps   . Hyperlipidemia   . Hypertension   . Myocardial infarction ([place])   . Nasal septal defect   . Penile cancer ([place]) 2014  . Polyarthropathy   . Pre-diabetes   . S/P trigger finger release    bilateral  . Sleep apnea   . Urinary outflow obstruction   Rheumatoid arthritis  PAST SURGICAL HISTORY:   Past Surgical History:  Procedure Laterality Date  . APPENDECTOMY    . CARDIAC CATHETERIZATION     with stent  . CARPAL TUNNEL RELEASE Bilateral   . COLONOSCOPY    . COLONOSCOPY WITH PROPOFOL N/A 10/03/2014  Procedure: COLONOSCOPY WITH PROPOFOL;  Surgeon: Manya Silvas, MD;  Location: [facility] ENDOSCOPY;  Service: Endoscopy;  Laterality: N/A;  . COLONOSCOPY WITH PROPOFOL N/A 09/13/2019   Procedure: COLONOSCOPY WITH PROPOFOL;  Surgeon: Toledo, Benay Pike, MD;  Location: [facility] ENDOSCOPY;  Service: Gastroenterology;  Laterality: N/A;  . ESOPHAGOGASTRODUODENOSCOPY    . GANGLION CYST EXCISION    . PENILE BIOPSY  2014   Squamous Cell Carcinoma  . REVERSE SHOULDER ARTHROPLASTY Left 11/11/2017   Procedure: REVERSE SHOULDER ARTHROPLASTY;  Surgeon: Corky Mull, MD;  Location: [facility] ORS;  Service: Orthopedics;  Laterality: Left;  . REVERSE SHOULDER ARTHROPLASTY Right 02/19/2018   Procedure: REVERSE SHOULDER  ARTHROPLASTY;  Surgeon: Corky Mull, MD;  Location: [facility] ORS;  Service: Orthopedics;  Laterality: Right;  . ROTATOR CUFF REPAIR    . TONSILLECTOMY      SOCIAL HISTORY:   Social History   Tobacco Use  . Smoking status: Former Smoker    Years: 15.00    Types: Cigarettes    Quit date: 10/23/1992    Years since quitting: 26.9  . Smokeless tobacco: Never Used  Substance Use Topics  . Alcohol use: No    Comment: rare    FAMILY HISTORY:   Family History  Problem Relation Age of Onset  . Stroke Mother   . Heart attack Father     DRUG ALLERGIES:  No Known Allergies  REVIEW OF SYSTEMS:   ROS As per history of present illness. All pertinent systems were reviewed above. Constitutional, HEENT, cardiovascular, respiratory, GI, GU, musculoskeletal, neuro, psychiatric, endocrine, integumentary and hematologic systems were reviewed and are otherwise negative/unremarkable except for positive findings mentioned above in the HPI.   MEDICATIONS AT HOME:   Prior to Admission medications   Medication Sig Start Date End Date Taking? Authorizing Provider  acetaminophen (TYLENOL) 500 MG tablet Take 1,000 mg by mouth every 6 (six) hours as needed for moderate pain.    Yes [provider]  allopurinol (ZYLOPRIM) 300 MG tablet Take 300 mg by mouth daily.   Yes [provider]  amitriptyline (ELAVIL) 25 MG tablet TAKE 1 TABLET BY MOUTH EVERY DAY AT NIGHT 10/20/18  Yes [provider]  amLODipine-benazepril (LOTREL) 5-20 MG per capsule Take 1 capsule by mouth daily.   Yes [provider]  aspirin 325 MG tablet Take 325 mg by mouth daily.   Yes [provider]  escitalopram (LEXAPRO) 10 MG tablet TAKE 1 TABLET BY MOUTH EVERY DAY 11/19/18  Yes [provider]  finasteride (PROSCAR) 5 MG tablet Take 1 tablet (5 mg total) by mouth daily. 11/19/18  Yes Hollice Espy, MD  hydroxychloroquine (PLAQUENIL) 200 MG tablet Take 200 mg by mouth daily.    Yes  [provider]  leflunomide (ARAVA) 10 MG tablet Take 10 mg by mouth daily.   Yes [provider]  montelukast (SINGULAIR) 10 MG tablet TAKE 1 TABLET BY MOUTH EVERY DAY AT NIGHT 10/06/18  Yes [provider]  rosuvastatin (CRESTOR) 20 MG tablet Take 20 mg by mouth daily.    Yes [provider]  tamsulosin (FLOMAX) 0.4 MG CAPS capsule Take 2 capsules (0.8 mg total) by mouth daily. 10/30/18  Yes Hollice Espy, MD  traMADol (ULTRAM) 50 MG tablet Take 1-2 tablets (50-100 mg total) by mouth every 6 (six) hours as needed for moderate pain. Patient taking differently: Take 50-100 mg by mouth 3 (three) times daily as needed for moderate pain.  02/19/18  Yes Lattie Corns, PA-C  VITAL SIGNS:  Blood pressure (!) 145/99, pulse 99, temperature 99.6 F (37.6 C), temperature source Oral, resp. rate (!) 21, height 5\' 5"  (1.651 m), weight 87.1 kg, SpO2 90 %.  PHYSICAL EXAMINATION:  Physical Exam  GENERAL:  79 y.o.-year-old Caucasian male patient lying in the bed with no acute distress.  EYES: Pupils equal, round, reactive to light and accommodation. No scleral icterus. Extraocular muscles intact.  HEENT: Head atraumatic, normocephalic. Oropharynx and nasopharynx clear.  NECK:  Supple, no jugular venous distention. No thyroid enlargement, no tenderness.  LUNGS: Normal breath sounds bilaterally, no wheezing, rales,rhonchi or crepitation. No use of accessory muscles of respiration.  CARDIOVASCULAR: Regular rate and rhythm, S1, S2 normal. No murmurs, rubs, or gallops.  ABDOMEN: Soft, nondistended, nontender. Bowel sounds present. No organomegaly or mass.  EXTREMITIES: No pedal edema, cyanosis, or clubbing.  NEUROLOGIC: Cranial nerves II through XII are intact. Muscle strength 5/5 in all extremities. Sensation intact. Gait not checked. Musculoskeletal: He had significant left sternomastoid tenderness on palpation. PSYCHIATRIC: The patient is alert and oriented x 3.   Normal affect and good eye contact. SKIN: No obvious rash, lesion, or ulcer.   LABORATORY PANEL:   CBC Recent Labs  Lab 10/10/19 0001  WBC 10.6*  HGB 13.9  HCT 40.7  PLT 196   ------------------------------------------------------------------------------------------------------------------  Chemistries  Recent Labs  Lab 10/10/19 0001  NA 138  K 4.1  CL 105  CO2 22  GLUCOSE 194*  BUN 20  CREATININE 1.43*  CALCIUM 9.0   ------------------------------------------------------------------------------------------------------------------  Cardiac Enzymes No results for input(s): TROPONINI in the last 168 hours. ------------------------------------------------------------------------------------------------------------------  RADIOLOGY:  CT Angio Neck W and/or Wo Contrast  Result Date: 10/10/2019 CLINICAL DATA:  Initial evaluation for acute left-sided neck pain, recent fall. EXAM: CT ANGIOGRAPHY NECK TECHNIQUE: Multidetector CT imaging of the neck was performed using the standard protocol during bolus administration of intravenous contrast. Multiplanar CT image reconstructions and MIPs were obtained to evaluate the vascular anatomy. Carotid stenosis measurements (when applicable) are obtained utilizing NASCET criteria, using the distal internal carotid diameter as the denominator. CONTRAST:  1103mL OMNIPAQUE IOHEXOL 350 MG/ML SOLN COMPARISON:  Comparison made with concomitant CT of the cervical spine. FINDINGS: Aortic arch: Visualized aortic arch of normal caliber with normal branch pattern. Mild-to-moderate atheromatous change about the arch and origin of the great vessels without hemodynamically significant stenosis. Evaluation of the proximal left common and subclavian arteries limited by streak artifact from adjacent venous contamination. Right carotid system: Right CCA patent from its origin to the bifurcation without stenosis. Scattered mixed plaque about the right  bifurcation/proximal right ICA with associated stenosis of up to 40% by NASCET criteria. Right ICA patent distally to the skull base without stenosis, dissection or occlusion. Prominent atherosclerotic change noted within the visualized right carotid siphon. Right external carotid artery and its branches intact without abnormality. Left carotid system: Evaluation of the proximal left CCA limited by adjacent venous contamination. Visualized portions of the left CCA patent without abnormality. A centric plaque at the left bifurcation/proximal left ICA with no more than mild 25% stenosis by NASCET criteria. Left ICA patent distally to the skull base without stenosis, dissection or occlusion. Prominent atherosclerotic change noted within the visualized left carotid siphon. Left external carotid artery and its branches intact without abnormality. Vertebral arteries: Both vertebral arteries arise from the subclavian arteries. No appreciable proximal subclavian artery stenosis. Vertebral arteries patent within the neck without stenosis, dissection or occlusion. Skeleton: Osseous structures better evaluated on concomitant CT of the  cervical spine. No definite acute osseous abnormality. Severe multilevel cervical spondylosis and facet arthrosis noted. No discrete or worrisome osseous lesions. Other neck: No other acute soft tissue abnormality within the neck. No mass lesion or adenopathy. Upper chest: Visualized upper chest better evaluated on concomitant CT of the chest. No definite acute abnormality. IMPRESSION: 1. Negative CTA of the neck with no evidence for acute traumatic vascular injury to the major arterial vasculature of the neck. 2. Atheromatous plaque about the carotid bifurcations/proximal ICAs bilaterally, with associated stenoses of up to 40% on the right and mild 25% on the left. 3. Severe multilevel cervical spondylosis and facet arthrosis, better evaluated on concomitant CT of the cervical spine.  Electronically Signed   By: Jeannine Boga M.D.   On: 10/10/2019 01:26   CT C-SPINE NO CHARGE  Result Date: 10/10/2019 CLINICAL DATA:  Neck pain EXAM: CT CERVICAL SPINE WITHOUT CONTRAST TECHNIQUE: Multiplanar CT image reconstructions were generated from previously performed CT angiography of the neck. No additional contrast was administered COMPARISON:  None. FINDINGS: Alignment: Straightening of the cervical spine. Trace anterolisthesis C7 on T1. Facet alignment is maintained. Skull base and vertebrae: No fracture is visualized. Soft tissues and spinal canal: No prevertebral fluid or swelling. No visible canal hematoma. Disc levels: Multiple level degenerative change. Severe degenerative changes C4 through C7 with bony ankylosis at these levels. Bulky anterior osteophytes at C5-C6. Effacement of the disc spaces. Posterior disc osteophyte at multiple levels. Advanced facet degenerative change throughout the cervical spine with multiple level bilateral foraminal stenosis, severe at C4-C5, C5-C6 and C6-C7. irregular facet degenerative change on the left at C3-C4. Upper chest: Negative. Other: None IMPRESSION: No definite acute fracture identified. Straightening of the cervical spine with severe degenerative changes from C4 through C7, including marked irregular facet arthropathy on the left side at C3-C4. MRI follow-up may be obtained depending upon the clinical course. See separately dictated CT angiography report for findings regarding the neck vasculature. Electronically Signed   By: Donavan Foil M.D.   On: 10/10/2019 01:14   CT Angio Chest/Abd/Pel for Dissection W and/or Wo Contrast  Result Date: 10/10/2019 CLINICAL DATA:  79 year old male with fall and right chest pain and back pain. EXAM: CT ANGIOGRAPHY CHEST, ABDOMEN AND PELVIS TECHNIQUE: Non-contrast CT of the chest was initially obtained. Multidetector CT imaging through the chest, abdomen and pelvis was performed using the standard protocol  during bolus administration of intravenous contrast. Multiplanar reconstructed images and MIPs were obtained and reviewed to evaluate the vascular anatomy. CONTRAST:  160mL OMNIPAQUE IOHEXOL 350 MG/ML SOLN COMPARISON:  Chest CT dated 01/27/2015. FINDINGS: Evaluation is limited due to streak artifact caused by patient's arms. CTA CHEST FINDINGS Cardiovascular: There is no cardiomegaly or pericardial effusion. There is coronary vascular calcification of the LAD and RCA. There is mild atherosclerotic calcification of the thoracic aorta. No aneurysmal dilatation or dissection. The central pulmonary arteries appear patent. Mediastinum/Nodes: No hilar or mediastinal adenopathy. The esophagus is grossly unremarkable. No mediastinal fluid collection. Lungs/Pleura: Mild emphysema. There are minimal bibasilar dependent atelectasis. No focal consolidation, pleural effusion, or pneumothorax. The central airways are patent Musculoskeletal: Bilateral short L arthroplasties with associated streak artifact. Degenerative changes of the spine. No acute osseous pathology. Review of the MIP images confirms the above findings. CTA ABDOMEN AND PELVIS FINDINGS VASCULAR Aorta: Moderate atherosclerotic calcification of the aorta. There is a 3 cm infrarenal abdominal aortic aneurysm. No dissection. No periaortic inflammation. Celiac: Patent without evidence of aneurysm, dissection, vasculitis or significant stenosis. SMA:  Patent without evidence of aneurysm, dissection, vasculitis or significant stenosis. Renals: Both renal arteries are patent without evidence of aneurysm, dissection, vasculitis, fibromuscular dysplasia or significant stenosis. IMA: Patent without evidence of aneurysm, dissection, vasculitis or significant stenosis. Inflow: Atherosclerotic calcification of the iliac arteries. No aneurysmal dilatation or dissection. Veins: No obvious venous abnormality within the limitations of this arterial phase study. Review of the MIP  images confirms the above findings. NON-VASCULAR No intra-abdominal free air or free fluid. Hepatobiliary: No focal liver abnormality is seen. No gallstones, gallbladder wall thickening, or biliary dilatation. Pancreas: Unremarkable. No pancreatic ductal dilatation or surrounding inflammatory changes. Spleen: Normal in size without focal abnormality. Adrenals/Urinary Tract: The adrenal glands unremarkable. Moderate bilateral renal parenchyma atrophy. There is no hydronephrosis on either side. The visualized ureters appear unremarkable. Mildly trabeculated appearance of the bladder wall, likely related to chronic bladder outlet obstruction. Stomach/Bowel: There is sigmoid diverticulosis without active inflammatory changes. There is no bowel obstruction or active inflammation. Appendectomy. Lymphatic: No adenopathy. Reproductive: Enlarged prostate gland with median lobe hypertrophy measuring approximately 5 cm in transverse diameter. Other: Small fat containing umbilical hernia. Musculoskeletal: Osteopenia with degenerative changes of the spine. No acute osseous pathology. Review of the MIP images confirms the above findings. IMPRESSION: 1. No acute intrathoracic, abdominal, or pelvic pathology. No CT evidence of aortic dissection. 2. A 3 cm infrarenal abdominal aortic aneurysm. Recommend follow-up every 3 years. This recommendation follows ACR consensus guidelines: White Paper of the ACR Incidental Findings Committee II on Vascular Findings. J Am Coll Radiol 2013; 10:789-794. 3. Sigmoid diverticulosis. No bowel obstruction. 4. 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 5. Aortic Atherosclerosis (ICD10-I70.0) and Emphysema (ICD10-J43.9). Electronically Signed   By: Anner Crete M.D.   On: 10/10/2019 01:10      IMPRESSION AND PLAN:   1.  Cervicalgia likely secondary to left sternomastoid strain. -The patient will be admitted to an observation medically monitored bed. -Pain management will be  provided. -We will place him on as needed Flexeril. -We will utilize as needed IV Toradol for pain.  2.  Recurrent falls. -Physical therapy evaluation will be obtained to assess his ambulation.  3.  Dyslipidemia. -We will continue statin therapy.  4.  Hypertension. -We will continue his antihypertensives.  5.  Gout. -We will continue allopurinol.  6.  BPH. -We will continue Flomax.  7.  Rheumatoid arthritis. -We will continue Arava and Plaquenil.  8.  DVT prophylaxis. -Subcutaneous Lovenox.    All the records are reviewed and case discussed with ED provider. The plan of care was discussed in details with the patient (and family). I answered all questions. The patient agreed to proceed with the above mentioned plan. Further management will depend upon hospital course.   CODE STATUS: Full code  Status is: Observation  The patient remains OBS appropriate and will d/c before 2 midnights.  Dispo: The patient is from: Home              Anticipated d/c is to: Home              Anticipated d/c date is: 1 day              Patient currently is not medically stable to d/c.    TOTAL TIME TAKING CARE OF THIS PATIENT: 55 minutes.    Christel Mormon M.D on 10/10/2019 at 4:40 AM  [facility]   From 7 PM-7 AM, contact night-coverage [email or web address]  CC: Primary care physician; Idelle Crouch, MD  Note: This dictation was prepared with Dragon dictation along with smaller phrase technology. Any transcriptional typo errors that result from this process are unintentional.

## 2019-10-10 NOTE — Progress Notes (Signed)
Physical Therapy Evaluation Patient Details Name: Jared Freeman MRN: [id number] DOB: 1940/10/27 Today's Date: 10/10/2019   History of Present Illness  Per MD note:Jared Freeman  is a 79 y.o. Caucasian male with a known history of asthma, coronary artery disease, type 2 diabetes mellitus, gout, hypertension and dyslipidemia who presented to the emergency room with acute onset of neck pain mainly on the left side.  He had an accidental mechanical fall 2 days ago at [place] [facility] and his wife believes that he may have twisted his neck.  He stated that he fell on the right hand and hurt his right chest wall.  He denied any head injuries paresthesias or focal muscle weakness during or after the fall.  No headache or dizziness or blurred vision or presyncope.  No nausea or vomiting or abdominal pain.  His wife stated that he has been having recurrent falls throughout the summer due to loss of balance.  He denies chest pain or palpitations.  No cough or wheezing or hemoptysis.  He has been vaccinated for COVID-19.  Clinical Impression  Patient agrees to PT evaluation. He is reporting 8/10 in his neck following a fall. He was able to roll left and right but was very fidgety due his high pain level. He had not eaten his breakfast due to not tolerating sitting and was thirsty and able to drink some of his drink. He attempted to sit at edge of bed but could not remain sitting due to increased pain and discomfort. He has WFL strength in BLE. He was not able to ambulate due to high neck pain. Patient will continue to benefit from skilled PT to improve mobility and decrease pain.     Follow Up Recommendations SNF    Equipment Recommendations  Rolling walker with 5" wheels    Recommendations for Other Services       Precautions / Restrictions Precautions Precautions: Fall Restrictions Weight Bearing Restrictions: No      Mobility  Bed Mobility Overal bed mobility: Modified Independent              General bed mobility comments:  (Patient very uncomfortable moving all around due to pain)  Transfers Overall transfer level:  (Patient's pain increased with sitting unable to stand)                  Ambulation/Gait Ambulation/Gait assistance:  (too painful to attempt ambulation)              Stairs            Wheelchair Mobility    Modified Rankin (Stroke Patients Only)       Balance Overall balance assessment: Independent                                           Pertinent Vitals/Pain Pain Assessment: 0-10 Pain Score: 8  Pain Location:  (neck) Pain Descriptors / Indicators: Grimacing;Moaning;Restless    Home Living Family/patient expects to be discharged to:: Unsure                      Prior Function Level of Independence: Independent               Hand Dominance        Extremity/Trunk Assessment   Upper Extremity Assessment Upper Extremity Assessment: Overall WFL for tasks assessed  Lower Extremity Assessment Lower Extremity Assessment: Overall WFL for tasks assessed       Communication   Communication: No difficulties  Cognition Arousal/Alertness: Awake/alert Behavior During Therapy: Restless Overall Cognitive Status: Within Functional Limits for tasks assessed                                        General Comments      Exercises     Assessment/Plan    PT Assessment Patient needs continued PT services  PT Problem List Decreased mobility;Decreased safety awareness;Pain       PT Treatment Interventions Gait training;Functional mobility training;Therapeutic activities    PT Goals (Current goals can be found in the Care Plan section)  Acute Rehab PT Goals Patient Stated Goal: to have no pain PT Goal Formulation: Patient unable to participate in goal setting Time For Goal Achievement: 10/24/19 Potential to Achieve Goals: Fair    Frequency Min 2X/week    Barriers to discharge        Co-evaluation               AM-PAC PT "6 Clicks" Mobility  Outcome Measure Help needed turning from your back to your side while in a flat bed without using bedrails?: A Little Help needed moving from lying on your back to sitting on the side of a flat bed without using bedrails?: A Little Help needed moving to and from a bed to a chair (including a wheelchair)?: Total Help needed standing up from a chair using your arms (e.g., wheelchair or bedside chair)?: Total Help needed to walk in hospital room?: Total Help needed climbing 3-5 steps with a railing? : Total 6 Click Score: 10    End of Session Equipment Utilized During Treatment: Gait belt;Oxygen Activity Tolerance: Patient limited by pain Patient left: in bed Nurse Communication:  (unable to find pateints phone-nsg aware) PT Visit Diagnosis: Pain Pain - part of body:  (neck)    Time: 1130-1150 PT Time Calculation (min) (ACUTE ONLY): 20 min   Charges:   PT Evaluation $PT Eval Low Complexity: 1 Low PT Treatments $Therapeutic Activity: 8-22 mins         Alanson Puls, PT DPT 10/10/2019, 12:51 PM

## 2019-10-10 NOTE — ED Notes (Signed)
Pt provided ice water 

## 2019-10-10 NOTE — ED Provider Notes (Signed)
[facility] Emergency Department Provider Note   ____________________________________________   First MD Initiated Contact with Patient 10/09/19 2359     (approximate)  I have reviewed the triage vital signs and the nursing notes.   HISTORY  Chief Complaint Neck Pain    HPI Jared Freeman is a 79 y.o. male brought to the ED via EMS from home with a chief complaint of neck pain.  Patient had a fall 2 days ago in the [place] clinic parking lot but was not seen.  Reports sudden onset sharp left-sided neck pain around 4 PM which has progressed over the course of the evening.  Took Tylenol and tramadol at home without relief of symptoms.  Takes a full-strength aspirin daily, no other anticoagulants. Also having mild right rib pain. Denies fever, headache, vision changes, cough, abdominal pain, nausea, vomiting or dizziness.       Past Medical History:  Diagnosis Date  . Anemia   . Anxiety   . Arthritis   . Asthma   . Coronary artery disease   . Diet-controlled type 2 diabetes mellitus ([place]) 08/27/2013  . Dyspnea   . Gout   . History of colon polyps   . Hyperlipidemia   . Hypertension   . Myocardial infarction ([place])   . Nasal septal defect   . Penile cancer ([place]) 2014  . Polyarthropathy   . Pre-diabetes   . S/P trigger finger release    bilateral  . Sleep apnea   . Urinary outflow obstruction     Patient Active Problem List   Diagnosis Date Noted  . 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 Hyperlipidemia 11/28/2017  . Asthma without status asthmaticus 11/28/2017  . Status post reverse arthroplasty of shoulder, left 11/11/2017  . Rotator cuff tendinitis, left 10/27/2017  . 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 GAD (generalized anxiety disorder) 08/24/2015  . Rotator cuff tear arthropathy of right shoulder 09/30/2014  . Sleep apnea 08/27/2013  . Urinary outflow  obstruction 08/27/2013  . Obesity 08/27/2013  . Hypertension 08/27/2013  . Hx of adenomatous colonic polyps 08/27/2013  . History of ASCVD (atherosclerotic cardiovascular disease) 08/27/2013  . H/O hyperglycemia 08/27/2013  . H/O cardiac catheterization 08/27/2013  . Gout 08/27/2013  . Frequent PVCs 08/27/2013  . Diet-controlled type 2 diabetes mellitus ([place]) 08/27/2013  . Bilateral claudication of lower limb ([place]) 08/27/2013  . 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 Malignant neoplasm of penis ([place]) 01/01/2013    Past Surgical History:  Procedure Laterality Date  . APPENDECTOMY    . CARDIAC CATHETERIZATION     with stent  . CARPAL TUNNEL RELEASE Bilateral   . COLONOSCOPY    . COLONOSCOPY WITH PROPOFOL N/A 10/03/2014   Procedure: COLONOSCOPY WITH PROPOFOL;  Surgeon: Manya Silvas, MD;  Location: [facility] ENDOSCOPY;  Service: Endoscopy;  Laterality: N/A;  . COLONOSCOPY WITH PROPOFOL N/A 09/13/2019   Procedure: COLONOSCOPY WITH PROPOFOL;  Surgeon: Toledo, Benay Pike, MD;  Location: [facility] ENDOSCOPY;  Service: Gastroenterology;  Laterality: N/A;  . ESOPHAGOGASTRODUODENOSCOPY    . GANGLION CYST EXCISION    . PENILE BIOPSY  2014   Squamous Cell Carcinoma  . REVERSE SHOULDER ARTHROPLASTY Left 11/11/2017   Procedure: REVERSE SHOULDER ARTHROPLASTY;  Surgeon: Corky Mull, MD;  Location: [facility] ORS;  Service: Orthopedics;  Laterality: Left;  . REVERSE SHOULDER ARTHROPLASTY Right 02/19/2018   Procedure: REVERSE SHOULDER ARTHROPLASTY;  Surgeon: Corky Mull, MD;  Location: [facility] ORS;  Service: Orthopedics;  Laterality: Right;  . ROTATOR CUFF REPAIR    . TONSILLECTOMY      Prior to Admission medications   Medication Sig Start Date End Date Taking? Authorizing Provider  acetaminophen (TYLENOL) 500 MG tablet Take 1,000 mg by mouth every 6 (six) hours as needed for moderate pain.     [provider]  allopurinol (ZYLOPRIM) 300 MG tablet Take 300 mg by mouth daily.    [provider]  amitriptyline (ELAVIL) 25 MG tablet TAKE 1 TABLET BY MOUTH EVERY DAY AT NIGHT 10/20/18   [provider]  amLODipine-benazepril (LOTREL) 5-20 MG per capsule Take 1 capsule by mouth daily.    [provider]  aspirin 325 MG tablet Take 325 mg by mouth daily.    [provider]  escitalopram (LEXAPRO) 10 MG tablet TAKE 1 TABLET BY MOUTH EVERY DAY 11/19/18   [provider]  finasteride (PROSCAR) 5 MG tablet Take 1 tablet (5 mg total) by mouth daily. 11/19/18   Hollice Espy, MD  hydroxychloroquine (PLAQUENIL) 200 MG tablet Take 200 mg by mouth daily.     [provider]  leflunomide (ARAVA) 10 MG tablet Take 10 mg by mouth daily.    [provider]  montelukast (SINGULAIR) 10 MG tablet TAKE 1 TABLET BY MOUTH EVERY DAY AT NIGHT 10/06/18   [provider]  rosuvastatin (CRESTOR) 20 MG tablet Take 20 mg by mouth daily.     [provider]  tamsulosin (FLOMAX) 0.4 MG CAPS capsule Take 2 capsules (0.8 mg total) by mouth daily. 10/30/18   Hollice Espy, MD  traMADol (ULTRAM) 50 MG tablet Take 1-2 tablets (50-100 mg total) by mouth every 6 (six) hours as needed for moderate pain. Patient taking differently: Take 50-100 mg by mouth 3 (three) times daily as needed for moderate pain.  02/19/18   Lattie Corns, PA-C    Allergies Patient has no known allergies.  Family History  Problem Relation Age of Onset  . Stroke Mother   . Heart attack Father     Social History Social History   Tobacco Use  . Smoking status: Former Smoker    Years: 15.00    Types: Cigarettes    Quit date: 10/23/1992    Years since quitting: 26.9  . Smokeless tobacco: Never Used  Vaping Use  . Vaping Use: Never used  Substance Use Topics  . Alcohol use: No    Comment: rare  . Drug use: No    Review of Systems  Constitutional: No fever/chills Eyes: No visual changes. ENT: No sore throat. Cardiovascular: Positive for right  chest pain. Respiratory: Denies shortness of breath. Gastrointestinal: No abdominal pain.  No nausea, no vomiting.  No diarrhea.  No constipation. Genitourinary: Negative for dysuria. Musculoskeletal: Positive for neck pain. Negative for back pain. Skin: Negative for rash. Neurological: Negative for headaches, focal weakness or numbness.   ____________________________________________   PHYSICAL EXAM:  VITAL SIGNS: ED Triage Vitals  Enc Vitals Group     BP 10/09/19 2359 (!) 153/119     Pulse Rate 10/09/19 2359 (!) 113     Resp 10/09/19 2359 (!) 23     Temp 10/09/19 2359 100 F (37.8 C)     Temp Source 10/09/19 2359 Oral     SpO2 10/09/19 2359 94 %     Weight 10/09/19 2357 192 lb 0.3 oz (87.1 kg)     Height 10/09/19 2357 5\' 5"  (1.651 m)  Head Circumference --      Peak Flow --      Pain Score 10/09/19 2356 10     Pain Loc --      Pain Edu? --      Excl. in [place]? --     Constitutional: Alert and oriented. Well appearing and in mild to moderate acute distress. Eyes: Conjunctivae are normal. PERRL. EOMI. Head: Atraumatic. Nose: Atraumatic. Mouth/Throat: Mucous membranes are moist.  No dental malocclusion.  Neck: No stridor.  No cervical spine tenderness to palpation.  No carotid bruits.  Left neck tender to palpation with limited range of motion turning towards the left against resistance.  Able to shrug shoulders and range shoulder freely without pain. Cardiovascular: Tachycardic rate, regular rhythm. Grossly normal heart sounds.  Good peripheral circulation. Respiratory: Normal respiratory effort.  No retractions. Lungs CTAB. Gastrointestinal: Soft and nontender to light or deep palpation. No distention. No abdominal bruits. No CVA tenderness. Musculoskeletal: No lower extremity tenderness nor edema.  No joint effusions. Neurologic:  Normal speech and language. No gross focal neurologic deficits are appreciated. No gait instability.  Able to walk from EMS stretcher to ED bed  without difficulty. Skin:  Skin is warm, dry and intact. No rash noted. Psychiatric: Mood and affect are normal. Speech and behavior are normal.  ____________________________________________   LABS (all labs ordered are listed, but only abnormal results are displayed)  Labs Reviewed  CBC WITH DIFFERENTIAL/PLATELET - Abnormal; Notable for the following components:      Result Value   WBC 10.6 (*)    Neutro Abs 8.9 (*)    All other components within normal limits  BASIC METABOLIC PANEL - Abnormal; Notable for the following components:   Glucose, Bld 194 (*)    Creatinine, Ser 1.43 (*)    GFR calc non Af Amer 47 (*)    GFR calc Af Amer 54 (*)    All other components within normal limits  SARS CORONAVIRUS 2 BY RT PCR (HOSPITAL ORDER, [place] [facility])   ____________________________________________  EKG  ED ECG REPORT I, Calen Posch J, the attending physician, personally viewed and interpreted this ECG.   Date: 10/10/2019  EKG Time: 2357  Rate: 114  Rhythm: sinus tachycardia  Axis: LAD  Intervals:left anterior fascicular block  ST&T Change: Nonspecific  ____________________________________________  RADIOLOGY  ED MD interpretation: Negative CTA of the neck, no acute fracture, no acute intrathoracic, abdominal or pelvic pathology; incidental 3 cm infrarenal AAA  Official radiology report(s): CT Angio Neck W and/or Wo Contrast  Result Date: 10/10/2019 CLINICAL DATA:  Initial evaluation for acute left-sided neck pain, recent fall. EXAM: CT ANGIOGRAPHY NECK TECHNIQUE: Multidetector CT imaging of the neck was performed using the standard protocol during bolus administration of intravenous contrast. Multiplanar CT image reconstructions and MIPs were obtained to evaluate the vascular anatomy. Carotid stenosis measurements (when applicable) are obtained utilizing NASCET criteria, using the distal internal carotid diameter as the denominator. CONTRAST:  116mL  OMNIPAQUE IOHEXOL 350 MG/ML SOLN COMPARISON:  Comparison made with concomitant CT of the cervical spine. FINDINGS: Aortic arch: Visualized aortic arch of normal caliber with normal branch pattern. Mild-to-moderate atheromatous change about the arch and origin of the great vessels without hemodynamically significant stenosis. Evaluation of the proximal left common and subclavian arteries limited by streak artifact from adjacent venous contamination. Right carotid system: Right CCA patent from its origin to the bifurcation without stenosis. Scattered mixed plaque about the right bifurcation/proximal right ICA with associated stenosis  of up to 40% by NASCET criteria. Right ICA patent distally to the skull base without stenosis, dissection or occlusion. Prominent atherosclerotic change noted within the visualized right carotid siphon. Right external carotid artery and its branches intact without abnormality. Left carotid system: Evaluation of the proximal left CCA limited by adjacent venous contamination. Visualized portions of the left CCA patent without abnormality. A centric plaque at the left bifurcation/proximal left ICA with no more than mild 25% stenosis by NASCET criteria. Left ICA patent distally to the skull base without stenosis, dissection or occlusion. Prominent atherosclerotic change noted within the visualized left carotid siphon. Left external carotid artery and its branches intact without abnormality. Vertebral arteries: Both vertebral arteries arise from the subclavian arteries. No appreciable proximal subclavian artery stenosis. Vertebral arteries patent within the neck without stenosis, dissection or occlusion. Skeleton: Osseous structures better evaluated on concomitant CT of the cervical spine. No definite acute osseous abnormality. Severe multilevel cervical spondylosis and facet arthrosis noted. No discrete or worrisome osseous lesions. Other neck: No other acute soft tissue abnormality within  the neck. No mass lesion or adenopathy. Upper chest: Visualized upper chest better evaluated on concomitant CT of the chest. No definite acute abnormality. IMPRESSION: 1. Negative CTA of the neck with no evidence for acute traumatic vascular injury to the major arterial vasculature of the neck. 2. Atheromatous plaque about the carotid bifurcations/proximal ICAs bilaterally, with associated stenoses of up to 40% on the right and mild 25% on the left. 3. Severe multilevel cervical spondylosis and facet arthrosis, better evaluated on concomitant CT of the cervical spine. Electronically Signed   By: Jeannine Boga M.D.   On: 10/10/2019 01:26   CT C-SPINE NO CHARGE  Result Date: 10/10/2019 CLINICAL DATA:  Neck pain EXAM: CT CERVICAL SPINE WITHOUT CONTRAST TECHNIQUE: Multiplanar CT image reconstructions were generated from previously performed CT angiography of the neck. No additional contrast was administered COMPARISON:  None. FINDINGS: Alignment: Straightening of the cervical spine. Trace anterolisthesis C7 on T1. Facet alignment is maintained. Skull base and vertebrae: No fracture is visualized. Soft tissues and spinal canal: No prevertebral fluid or swelling. No visible canal hematoma. Disc levels: Multiple level degenerative change. Severe degenerative changes C4 through C7 with bony ankylosis at these levels. Bulky anterior osteophytes at C5-C6. Effacement of the disc spaces. Posterior disc osteophyte at multiple levels. Advanced facet degenerative change throughout the cervical spine with multiple level bilateral foraminal stenosis, severe at C4-C5, C5-C6 and C6-C7. irregular facet degenerative change on the left at C3-C4. Upper chest: Negative. Other: None IMPRESSION: No definite acute fracture identified. Straightening of the cervical spine with severe degenerative changes from C4 through C7, including marked irregular facet arthropathy on the left side at C3-C4. MRI follow-up may be obtained depending  upon the clinical course. See separately dictated CT angiography report for findings regarding the neck vasculature. Electronically Signed   By: Donavan Foil M.D.   On: 10/10/2019 01:14   CT Angio Chest/Abd/Pel for Dissection W and/or Wo Contrast  Result Date: 10/10/2019 CLINICAL DATA:  79 year old male with fall and right chest pain and back pain. EXAM: CT ANGIOGRAPHY CHEST, ABDOMEN AND PELVIS TECHNIQUE: Non-contrast CT of the chest was initially obtained. Multidetector CT imaging through the chest, abdomen and pelvis was performed using the standard protocol during bolus administration of intravenous contrast. Multiplanar reconstructed images and MIPs were obtained and reviewed to evaluate the vascular anatomy. CONTRAST:  174mL OMNIPAQUE IOHEXOL 350 MG/ML SOLN COMPARISON:  Chest CT dated 01/27/2015. FINDINGS: Evaluation is  limited due to streak artifact caused by patient's arms. CTA CHEST FINDINGS Cardiovascular: There is no cardiomegaly or pericardial effusion. There is coronary vascular calcification of the LAD and RCA. There is mild atherosclerotic calcification of the thoracic aorta. No aneurysmal dilatation or dissection. The central pulmonary arteries appear patent. Mediastinum/Nodes: No hilar or mediastinal adenopathy. The esophagus is grossly unremarkable. No mediastinal fluid collection. Lungs/Pleura: Mild emphysema. There are minimal bibasilar dependent atelectasis. No focal consolidation, pleural effusion, or pneumothorax. The central airways are patent Musculoskeletal: Bilateral short L arthroplasties with associated streak artifact. Degenerative changes of the spine. No acute osseous pathology. Review of the MIP images confirms the above findings. CTA ABDOMEN AND PELVIS FINDINGS VASCULAR Aorta: Moderate atherosclerotic calcification of the aorta. There is a 3 cm infrarenal abdominal aortic aneurysm. No dissection. No periaortic inflammation. Celiac: Patent without evidence of aneurysm,  dissection, vasculitis or significant stenosis. SMA: Patent without evidence of aneurysm, dissection, vasculitis or significant stenosis. Renals: Both renal arteries are patent without evidence of aneurysm, dissection, vasculitis, fibromuscular dysplasia or significant stenosis. IMA: Patent without evidence of aneurysm, dissection, vasculitis or significant stenosis. Inflow: Atherosclerotic calcification of the iliac arteries. No aneurysmal dilatation or dissection. Veins: No obvious venous abnormality within the limitations of this arterial phase study. Review of the MIP images confirms the above findings. NON-VASCULAR No intra-abdominal free air or free fluid. Hepatobiliary: No focal liver abnormality is seen. No gallstones, gallbladder wall thickening, or biliary dilatation. Pancreas: Unremarkable. No pancreatic ductal dilatation or surrounding inflammatory changes. Spleen: Normal in size without focal abnormality. Adrenals/Urinary Tract: The adrenal glands unremarkable. Moderate bilateral renal parenchyma atrophy. There is no hydronephrosis on either side. The visualized ureters appear unremarkable. Mildly trabeculated appearance of the bladder wall, likely related to chronic bladder outlet obstruction. Stomach/Bowel: There is sigmoid diverticulosis without active inflammatory changes. There is no bowel obstruction or active inflammation. Appendectomy. Lymphatic: No adenopathy. Reproductive: Enlarged prostate gland with median lobe hypertrophy measuring approximately 5 cm in transverse diameter. Other: Small fat containing umbilical hernia. Musculoskeletal: Osteopenia with degenerative changes of the spine. No acute osseous pathology. Review of the MIP images confirms the above findings. IMPRESSION: 1. No acute intrathoracic, abdominal, or pelvic pathology. No CT evidence of aortic dissection. 2. A 3 cm infrarenal abdominal aortic aneurysm. Recommend follow-up every 3 years. This recommendation follows ACR  consensus guidelines: White Paper of the ACR Incidental Findings Committee II on Vascular Findings. J Am Coll Radiol 2013; 10:789-794. 3. Sigmoid diverticulosis. No bowel obstruction. 4. 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 5. Aortic Atherosclerosis (ICD10-I70.0) and Emphysema (ICD10-J43.9). Electronically Signed   By: Anner Crete M.D.   On: 10/10/2019 01:10    ____________________________________________   PROCEDURES  Procedure(s) performed (including Critical Care):  .1-3 Lead EKG Interpretation Performed by: Paulette Blanch, MD Authorized by: Paulette Blanch, MD     Interpretation: abnormal     ECG rate:  110   ECG rate assessment: tachycardic     Rhythm: sinus tachycardia     Ectopy: none     Conduction: normal   Comments:     Patient placed on cardiac monitor to evaluate for arrhythmias     ____________________________________________   INITIAL IMPRESSION / ASSESSMENT AND PLAN / ED COURSE  As part of my medical decision making, I reviewed the following data within the [place] notes reviewed and incorporated, Labs reviewed, EKG interpreted, Old chart reviewed, Radiograph reviewed , Notes from prior ED visits and [place] Controlled Substance Database  Michaeljohn Cordaryl Decelles was evaluated in Emergency Department on 8/29/2021for the symptoms described in the history of present illness. He was evaluated in the context of the global COVID-19 pandemic, which necessitated consideration that the patient might be at risk for infection with the SARS-CoV-2 virus that causes COVID-19. Institutional protocols and algorithms that pertain to the evaluation of patients at risk for COVID-19 are in a state of rapid change based on information released by regulatory bodies including the CDC and federal and state organizations. These policies and algorithms were followed during the patient's care in the ED.    79 year old male who fell 2 days ago presenting  with acute left-sided neck pain.  Also with mild right-sided rib pain.  Differential diagnosis includes but is not limited to musculoskeletal injury, cervical spine injury, carotid artery injury, rib fracture, PE, dissection, etc.  We will obtain basic lab work, CT C-spine/carotid/chest dissection protocol.  Administer IV hydration, IV morphine for pain and reassess.  Clinical Course as of Oct 09 348  Sun Oct 10, 2019  0156 Updated patient, his wife and son on CT imaging results.  Patient is still writhing with pain, slightly improved after IV Morphine but no better after Versed.  There is no osseous or vascular etiology for his pain.  Patient is tachycardic to 117.  Will administer second liter IV fluids, trial low-dose Toradol.  If unable to control his pain, will discuss with hospitalist services for admission.   [JS]  0157 Pain returning after IV Toradol.  Will try low-dose IV Dilaudid.  Will discuss with hospital services for admission.   [JS]    Clinical Course User Index [JS] Paulette Blanch, MD     ____________________________________________   FINAL CLINICAL IMPRESSION(S) / ED DIAGNOSES  Final diagnoses:  Pain  Neck pain  Abdominal aortic aneurysm (AAA) without rupture [facility])     ED Discharge Orders    None       Note:  This document was prepared using Dragon voice recognition software and may include unintentional dictation errors.   Paulette Blanch, MD 10/10/19 [phone]

## 2019-10-10 NOTE — ED Notes (Signed)
Pt taken to MRI  

## 2019-10-10 NOTE — ED Notes (Signed)
MD Sung at bedside. 

## 2019-10-10 NOTE — Progress Notes (Signed)
Pharmacy-Provider Communication   Patient is order Fluticasone (Flovent HFA) 110 mcg MDI, or 100 mcg DPI BID. This is a non-formulary medication.   SARS Coronavirus 2: Negative   Per [place] Policy, Budesonide (Pulmicort) neb solution 0.25 mg/2 ml BID has been substituted for Flovent HFA.   Pernell Dupre, PharmD, BCPS Clinical Pharmacist 10/10/2019 10:04 AM

## 2019-10-10 NOTE — Progress Notes (Signed)
Called wife to inform of new room 230. No answer, vm left.

## 2019-10-10 NOTE — ED Notes (Signed)
Wife at bedside. Pt reported neck pain 8/10. IV toradol given. No further needs expressed at this time.

## 2019-10-11 DIAGNOSIS — Z7982 Long term (current) use of aspirin: Secondary | ICD-10-CM | POA: Diagnosis not present

## 2019-10-11 DIAGNOSIS — N1831 Chronic kidney disease, stage 3a: Secondary | ICD-10-CM | POA: Diagnosis present

## 2019-10-11 DIAGNOSIS — Z79899 Other long term (current) drug therapy: Secondary | ICD-10-CM | POA: Diagnosis not present

## 2019-10-11 DIAGNOSIS — S065X9A Traumatic subdural hemorrhage with loss of consciousness of unspecified duration, initial encounter: Secondary | ICD-10-CM | POA: Diagnosis present

## 2019-10-11 DIAGNOSIS — J9601 Acute respiratory failure with hypoxia: Secondary | ICD-10-CM | POA: Diagnosis present

## 2019-10-11 DIAGNOSIS — Z8249 Family history of ischemic heart disease and other diseases of the circulatory system: Secondary | ICD-10-CM | POA: Diagnosis not present

## 2019-10-11 DIAGNOSIS — J441 Chronic obstructive pulmonary disease with (acute) exacerbation: Secondary | ICD-10-CM

## 2019-10-11 DIAGNOSIS — E785 Hyperlipidemia, unspecified: Secondary | ICD-10-CM | POA: Diagnosis present

## 2019-10-11 DIAGNOSIS — E1122 Type 2 diabetes mellitus with diabetic chronic kidney disease: Secondary | ICD-10-CM | POA: Diagnosis present

## 2019-10-11 DIAGNOSIS — I130 Hypertensive heart and chronic kidney disease with heart failure and stage 1 through stage 4 chronic kidney disease, or unspecified chronic kidney disease: Secondary | ICD-10-CM | POA: Diagnosis present

## 2019-10-11 DIAGNOSIS — I2510 Atherosclerotic heart disease of native coronary artery without angina pectoris: Secondary | ICD-10-CM | POA: Diagnosis present

## 2019-10-11 DIAGNOSIS — M542 Cervicalgia: Secondary | ICD-10-CM | POA: Diagnosis not present

## 2019-10-11 DIAGNOSIS — Z87891 Personal history of nicotine dependence: Secondary | ICD-10-CM | POA: Diagnosis not present

## 2019-10-11 DIAGNOSIS — I252 Old myocardial infarction: Secondary | ICD-10-CM | POA: Diagnosis not present

## 2019-10-11 DIAGNOSIS — Z96612 Presence of left artificial shoulder joint: Secondary | ICD-10-CM | POA: Diagnosis present

## 2019-10-11 DIAGNOSIS — F419 Anxiety disorder, unspecified: Secondary | ICD-10-CM | POA: Diagnosis present

## 2019-10-11 DIAGNOSIS — M47812 Spondylosis without myelopathy or radiculopathy, cervical region: Secondary | ICD-10-CM | POA: Diagnosis present

## 2019-10-11 DIAGNOSIS — Z8549 Personal history of malignant neoplasm of other male genital organs: Secondary | ICD-10-CM | POA: Diagnosis not present

## 2019-10-11 DIAGNOSIS — R296 Repeated falls: Secondary | ICD-10-CM | POA: Diagnosis present

## 2019-10-11 DIAGNOSIS — I5033 Acute on chronic diastolic (congestive) heart failure: Secondary | ICD-10-CM | POA: Diagnosis present

## 2019-10-11 DIAGNOSIS — Z96611 Presence of right artificial shoulder joint: Secondary | ICD-10-CM | POA: Diagnosis present

## 2019-10-11 DIAGNOSIS — Z20822 Contact with and (suspected) exposure to covid-19: Secondary | ICD-10-CM | POA: Diagnosis present

## 2019-10-11 DIAGNOSIS — S065XAA Traumatic subdural hemorrhage with loss of consciousness status unknown, initial encounter: Secondary | ICD-10-CM

## 2019-10-11 DIAGNOSIS — W1830XA Fall on same level, unspecified, initial encounter: Secondary | ICD-10-CM | POA: Diagnosis present

## 2019-10-11 DIAGNOSIS — Z823 Family history of stroke: Secondary | ICD-10-CM | POA: Diagnosis not present

## 2019-10-11 DIAGNOSIS — I444 Left anterior fascicular block: Secondary | ICD-10-CM | POA: Diagnosis present

## 2019-10-11 DIAGNOSIS — Y92481 Parking lot as the place of occurrence of the external cause: Secondary | ICD-10-CM | POA: Diagnosis not present

## 2019-10-11 DIAGNOSIS — M109 Gout, unspecified: Secondary | ICD-10-CM | POA: Diagnosis present

## 2019-10-11 LAB — GLUCOSE, CAPILLARY
Glucose-Capillary: 113 mg/dL — ABNORMAL HIGH (ref 70–99)
Glucose-Capillary: 150 mg/dL — ABNORMAL HIGH (ref 70–99)
Glucose-Capillary: 151 mg/dL — ABNORMAL HIGH (ref 70–99)
Glucose-Capillary: 144 mg/dL — ABNORMAL HIGH (ref 70–99)

## 2019-10-11 LAB — BRAIN NATRIURETIC PEPTIDE: B Natriuretic Peptide: 498 pg/mL — ABNORMAL HIGH (ref 0.0–100.0)

## 2019-10-11 MED ORDER — PREDNISONE 20 MG PO TABS
40.0000 mg | ORAL_TABLET | Freq: Every day | ORAL | Status: DC
  Administered 2019-10-11 – 2019-10-13 (×3): 40 mg via ORAL
  Filled 2019-10-11 (×3): qty 2

## 2019-10-11 MED ORDER — MELATONIN 5 MG PO TABS
5.0000 mg | ORAL_TABLET | Freq: Every day | ORAL | Status: DC
  Administered 2019-10-11 – 2019-10-12 (×2): 5 mg via ORAL
  Filled 2019-10-11 (×2): qty 1

## 2019-10-11 MED ORDER — AZITHROMYCIN 250 MG PO TABS
500.0000 mg | ORAL_TABLET | Freq: Every day | ORAL | Status: DC
  Administered 2019-10-11 – 2019-10-13 (×3): 500 mg via ORAL
  Filled 2019-10-11 (×3): qty 2

## 2019-10-11 MED ORDER — TIOTROPIUM BROMIDE MONOHYDRATE 18 MCG IN CAPS
18.0000 ug | ORAL_CAPSULE | Freq: Every day | RESPIRATORY_TRACT | Status: DC
  Administered 2019-10-11 – 2019-10-13 (×3): 18 ug via RESPIRATORY_TRACT
  Filled 2019-10-11: qty 5

## 2019-10-11 MED ORDER — OXYCODONE-ACETAMINOPHEN 5-325 MG PO TABS
1.0000 | ORAL_TABLET | ORAL | Status: DC | PRN
  Administered 2019-10-11 – 2019-10-13 (×3): 1 via ORAL
  Filled 2019-10-11 (×3): qty 1

## 2019-10-11 MED ORDER — MOMETASONE FURO-FORMOTEROL FUM 200-5 MCG/ACT IN AERO
2.0000 | INHALATION_SPRAY | Freq: Two times a day (BID) | RESPIRATORY_TRACT | Status: DC
  Administered 2019-10-11 – 2019-10-13 (×4): 2 via RESPIRATORY_TRACT
  Filled 2019-10-11: qty 8.8

## 2019-10-11 MED ORDER — LIDOCAINE 5 % EX PTCH
1.0000 | MEDICATED_PATCH | CUTANEOUS | Status: DC
  Administered 2019-10-11 – 2019-10-13 (×3): 1 via TRANSDERMAL
  Filled 2019-10-11 (×3): qty 1

## 2019-10-11 NOTE — Consult Note (Signed)
Neurosurgery-New Consultation Evaluation 10/11/2019 Makaio Mach [id number]  Identifying Statement: Brix Brearley is a 79 y.o. male from [place] [address] with neck pain after recent fall  Physician Requesting Consultation: Sharen Hones, MD  History of Present Illness: Mr. Hickling is known to me for prior history of back and leg pain.  He states he had a fall this prior week and has dealt with different pains since then.  He is currently complaining of severe chest pain and he does have continued back pain.  However, in the last few days he started developing left-sided neck pain.  He did not note the neck pain immediately after the fall.  He was admitted and has had a CT and MRI of the cervical spine.  He is not in a cervical collar.  He does not endorse any radiating pain down his arms.  He has some intermittent numbness in his finger which is chronic but no new numbness.  He has been able to ambulate.  Past Medical History:  Past Medical History:  Diagnosis Date  . Anemia   . Anxiety   . Arthritis   . Asthma   . Coronary artery disease   . Diet-controlled type 2 diabetes mellitus ([place]) 08/27/2013  . Dyspnea   . Gout   . History of colon polyps   . Hyperlipidemia   . Hypertension   . Myocardial infarction ([place])   . Nasal septal defect   . 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 Polyarthropathy   . Pre-diabetes   . S/P trigger finger release    bilateral  . Sleep apnea   . Urinary outflow obstruction     Social History: Social History   Socioeconomic History  . Marital status: Married    Spouse name: Not on file  . Number of children: Not on file  . Years of education: Not on file  . Highest education level: Not on file  Occupational History  . Not on file  Tobacco Use  . Smoking status: Former Smoker    Years: 15.00    Types: Cigarettes    Quit date: 10/23/1992    Years since quitting: 26.9  . Smokeless tobacco: Never Used  Vaping Use  . Vaping Use: Never used   Substance and Sexual Activity  . Alcohol use: No    Comment: rare  . Drug use: No  . Sexual activity: Not Currently  Other Topics Concern  . Not on file  Social History Narrative  . Not on file   Social Determinants of Health   Financial Resource Strain:   . Difficulty of Paying Living Expenses: Not on file  Food Insecurity:   . Worried About Charity fundraiser in the Last Year: Not on file  . Ran Out of Food in the Last Year: Not on file  Transportation Needs:   . Lack of Transportation (Medical): Not on file  . Lack of Transportation (Non-Medical): Not on file  Physical Activity:   . Days of Exercise per Week: Not on file  . Minutes of Exercise per Session: Not on file  Stress:   . Feeling of Stress : Not on file  Social Connections:   . Frequency of Communication with Friends and Family: Not on file  . Frequency of Social Gatherings with Friends and Family: Not on file  . Attends Religious Services: Not on file  . Active Member of Clubs or Organizations: Not on file  . Attends Archivist Meetings: Not on  file  . Marital Status: Not on file  Intimate Partner Violence:   . Fear of Current or Ex-Partner: Not on file  . Emotionally Abused: Not on file  . Physically Abused: Not on file  . Sexually Abused: Not on file   Living arrangements (living alone, with partner): Spouse is in room  Family History: Family History  Problem Relation Age of Onset  . Stroke Mother   . Heart attack Father     Review of Systems:  Review of Systems - General ROS: Positive for chest pain Psychological ROS: Negative Ophthalmic ROS: Negative ENT ROS: Negative Hematological and Lymphatic ROS: Negative  Endocrine ROS: Negative Respiratory ROS: Negative Cardiovascular ROS: Negative Gastrointestinal ROS: Negative Genito-Urinary ROS: Negative Musculoskeletal ROS: Positive for neck pain, back pain Neurological ROS: Negative arm pain, numbness, weakness Dermatological ROS:  Negative  Physical Exam: BP (!) 147/83 (BP Location: Right Arm)   Pulse 93   Temp 98.6 F (37 C) (Oral)   Resp 20   Ht 5\' 5"  (1.651 m)   Wt 96.6 kg   SpO2 93%   BMI 35.44 kg/m  Body mass index is 35.44 kg/m. Body surface area is 2.1 meters squared. General appearance: Alert, cooperative, appears uncomfortable, wearing oxygen Head: Normocephalic, atraumatic Eyes: Normal, EOM intact Oropharynx: Moist without lesions Neck: Tenderness noted along the left side of the cervical spine, no midline tenderness  Ext: No edema in extremities, no obvious deformities  Neurologic exam:  Mental status: alertness: alert, affect: normal Speech: fluent and clear Motor:strength symmetric 5/5 in bilateral deltoids, bicep, tricep, interossei, grip.  He is 5 out of 5 in bilateral hip flexion, dorsiflexion and plantarflexion. Sensory: intact to light touch in all extremities Reflexes: 2+ and symmetric bilaterally for patella Gait: Not tested  Laboratory: Results for orders placed or performed during the hospital encounter of 10/09/19  SARS Coronavirus 2 by RT PCR (hospital order, performed in [facility] hospital lab) Nasopharyngeal Nasopharyngeal Swab   Specimen: Nasopharyngeal Swab  Result Value Ref Range   SARS Coronavirus 2 NEGATIVE NEGATIVE  CBC with Differential  Result Value Ref Range   WBC 10.6 (H) 4.0 - 10.5 K/uL   RBC 4.52 4.22 - 5.81 MIL/uL   Hemoglobin 13.9 13.0 - 17.0 g/dL   HCT 40.7 39 - 52 %   MCV 90.0 80.0 - 100.0 fL   MCH 30.8 26.0 - 34.0 pg   MCHC 34.2 30.0 - 36.0 g/dL   RDW 14.3 11.5 - 15.5 %   Platelets 196 150 - 400 K/uL   nRBC 0.0 0.0 - 0.2 %   Neutrophils Relative % 84 %   Neutro Abs 8.9 (H) 1.7 - 7.7 K/uL   Lymphocytes Relative 10 %   Lymphs Abs 1.0 0.7 - 4.0 K/uL   Monocytes Relative 5 %   Monocytes Absolute 0.5 0 - 1 K/uL   Eosinophils Relative 1 %   Eosinophils Absolute 0.1 0 - 0 K/uL   Basophils Relative 0 %   Basophils Absolute 0.0 0 - 0 K/uL   Immature  Granulocytes 0 %   Abs Immature Granulocytes 0.04 0.00 - 0.07 K/uL  Basic metabolic panel  Result Value Ref Range   Sodium 138 135 - 145 mmol/L   Potassium 4.1 3.5 - 5.1 mmol/L   Chloride 105 98 - 111 mmol/L   CO2 22 22 - 32 mmol/L   Glucose, Bld 194 (H) 70 - 99 mg/dL   BUN 20 8 - 23 mg/dL   Creatinine, Ser 1.43 (  H) 0.61 - 1.24 mg/dL   Calcium 9.0 8.9 - 10.3 mg/dL   GFR calc non Af Amer 47 (L) >60 mL/min   GFR calc Af Amer 54 (L) >60 mL/min   Anion gap 11 5 - 15  Basic metabolic panel  Result Value Ref Range   Sodium 137 135 - 145 mmol/L   Potassium 4.1 3.5 - 5.1 mmol/L   Chloride 105 98 - 111 mmol/L   CO2 23 22 - 32 mmol/L   Glucose, Bld 209 (H) 70 - 99 mg/dL   BUN 17 8 - 23 mg/dL   Creatinine, Ser 1.33 (H) 0.61 - 1.24 mg/dL   Calcium 8.0 (L) 8.9 - 10.3 mg/dL   GFR calc non Af Amer 51 (L) >60 mL/min   GFR calc Af Amer 59 (L) >60 mL/min   Anion gap 9 5 - 15  CBC  Result Value Ref Range   WBC 15.4 (H) 4.0 - 10.5 K/uL   RBC 4.04 (L) 4.22 - 5.81 MIL/uL   Hemoglobin 12.1 (L) 13.0 - 17.0 g/dL   HCT 37.6 (L) 39 - 52 %   MCV 93.1 80.0 - 100.0 fL   MCH 30.0 26.0 - 34.0 pg   MCHC 32.2 30.0 - 36.0 g/dL   RDW 14.0 11.5 - 15.5 %   Platelets 167 150 - 400 K/uL   nRBC 0.0 0.0 - 0.2 %  Glucose, capillary  Result Value Ref Range   Glucose-Capillary 166 (H) 70 - 99 mg/dL  Hemoglobin A1c  Result Value Ref Range   Hgb A1c MFr Bld 6.6 (H) 4.8 - 5.6 %   Mean Plasma Glucose 142.72 mg/dL  Glucose, capillary  Result Value Ref Range   Glucose-Capillary 160 (H) 70 - 99 mg/dL  Glucose, capillary  Result Value Ref Range   Glucose-Capillary 113 (H) 70 - 99 mg/dL   Comment 1 Notify RN    I personally reviewed labs  Imaging: MRI cervical spine:1. No acute fracture or cord injury. 2. Thin asymmetric heterogeneous fluid signal in the left posterolateral epidural space at C4 may represent a small amount of epidural hematoma. Small amount of prevertebral edema. 3. Upper cervical paraspinous  soft tissue and muscle edema, greater on the left. 4. Multilevel cervical spondylosis as described above. Moderate to severe spinal canal stenosis at C5-C6. Multilevel severe neuroforaminal stenosis.  CTA cervical spine:1. Negative CTA of the neck with no evidence for acute traumatic vascular injury to the major arterial vasculature of the neck. 2. Atheromatous plaque about the carotid bifurcations/proximal ICAs bilaterally, with associated stenoses of up to 40% on the right and mild 25% on the left. 3. Severe multilevel cervical spondylosis and facet arthrosis, better evaluated on concomitant CT of the cervical spine.  Impression/Plan:  Mr. Paris is here after a fall and has multiple pains, we are consulted for the acute left-sided neck pain.  I did review the MRI and there is soft tissue edema consistent with likely musculoskeletal injury.  However, the CT and MRI do not show any obvious acute fracture.  He does have severe diffuse degenerative disease and also has some central spinal stenosis but his neurologic exam is reassuring.  Given no neurologic symptoms at this time, no indication for surgery is seen.  I did speak with the patient and wife about the neck pain and I do think this will improve with time.  I do recommend physical therapy, heat, medications for treatment.  I do not see a role for cervical immobilization given  no acute fracture.  We can follow-up in clinic for reevaluation in a couple of weeks.   1.  Diagnosis: Acute neck pain  2.  Plan -No surgical indication seen on MRI. -Recommend physical therapy, continued pain control -Follow-up in clinic

## 2019-10-11 NOTE — TOC Initial Note (Signed)
Transition of Care [facility]) - Initial/Assessment Note    Patient Details  Name: Jared Freeman MRN: [id number] Date of Birth: [birth year]  [facility] CM/SW Contact:    Beverly Sessions, RN Phone Number: 10/11/2019, 4:25 PM  Clinical Narrative:                 Assessment completed via phone with wife.  At this time patient is sensitive to sound, and request assessment be completed with wife  Patient lives at home with wife.  PCP [place] - wife states she primarily drives [facility] - states that there have been medications in the past they have been unable to afford, but PCP works with them to change to a different medication.  At this time patient is able to afford all of his home medications  Patient does not have any medical equipment at home  Currently on 4L acute O2  PT has recommended SNF Patient and wife in agreement if it is covered by insurance.  TOC notified wife, that patient would have to have a 3 night inpatient stay unless there is a facility that is waiving the 3 night stay.  She confirms she understands  Existing PASRR fl2 sent for signature Bed search initiated    Expected Discharge Plan: [place]     Patient Goals and CMS Choice        Expected Discharge Plan and Services Expected Discharge Plan: [place]   Discharge Planning Services: CM Consult   Living arrangements for the past 2 months: Single Family Home                                      Prior Living Arrangements/Services Living arrangements for the past 2 months: Single Family Home Lives with:: Spouse Patient language and need for interpreter reviewed:: Yes Do you feel safe going back to the place where you live?: Yes      Need for Family Participation in Patient Care: Yes (Comment) Care giver support system in place?: Yes (comment)   Criminal Activity/Legal Involvement Pertinent to Current Situation/Hospitalization: No - Comment  as needed  Activities of Daily Living Home Assistive Devices/Equipment: CPAP, Grab bars in shower, Grab bars around toilet ADL Screening (condition at time of admission) Patient's cognitive ability adequate to safely complete daily activities?: Yes Is the patient deaf or have difficulty hearing?: No Does the patient have difficulty seeing, even when wearing glasses/contacts?: No Does the patient have difficulty concentrating, remembering, or making decisions?: No Patient able to express need for assistance with ADLs?: Yes Does the patient have difficulty dressing or bathing?: No Independently performs ADLs?: Yes (appropriate for developmental age) Does the patient have difficulty walking or climbing stairs?: Yes Weakness of Legs: Both Weakness of Arms/Hands: None  Permission Sought/Granted                  Emotional Assessment       Orientation: : Oriented to Self, Oriented to Place, Oriented to  Time, Oriented to Situation      Admission diagnosis:  Neck pain [M54.2] Pain [R52] Abdominal aortic aneurysm (AAA) without rupture ([place]) [I71.4] Acute hypoxemic respiratory failure ([place]) [J96.01] Patient Active Problem List   Diagnosis Date Noted  . Acute hypoxemic respiratory failure ([place]) 10/11/2019  . Subdural hematoma ([place]) 10/11/2019  . COPD with acute exacerbation ([place]) 10/11/2019  . Neck pain 10/10/2019  .  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 Hyperlipidemia 11/28/2017  . Asthma without status asthmaticus 11/28/2017  . Status post reverse arthroplasty of shoulder, left 11/11/2017  . Rotator cuff tendinitis, left 10/27/2017  . 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 GAD (generalized anxiety disorder) 08/24/2015  . Rotator cuff tear arthropathy of right shoulder 09/30/2014  . Sleep apnea 08/27/2013  . Urinary outflow obstruction 08/27/2013  . Obesity 08/27/2013  . Hypertension 08/27/2013  .  Hx of adenomatous colonic polyps 08/27/2013  . History of ASCVD (atherosclerotic cardiovascular disease) 08/27/2013  . H/O hyperglycemia 08/27/2013  . H/O cardiac catheterization 08/27/2013  . Gout 08/27/2013  . Frequent PVCs 08/27/2013  . Diet-controlled type 2 diabetes mellitus ([place]) 08/27/2013  . Bilateral claudication of lower limb ([place]) 08/27/2013  . 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 Malignant neoplasm of penis ([place]) 01/01/2013   PCP:  Idelle Crouch, MD Pharmacy:   [id number] [place], [place] [address] Phone: [phone] Fax: [place], [place] [address] Phone: [phone] Fax: [phone]     Social Determinants of Health (SDOH) Interventions    Readmission Risk Interventions No flowsheet data found.

## 2019-10-11 NOTE — Progress Notes (Signed)
PROGRESS NOTE    Jared Freeman  WUJ:[id number] DOB: [birth year] DOA: 10/09/2019 PCP: Idelle Crouch, MD   Chief complaint.  Neck pain and shortness of breath.  Brief Narrative:  Jared Freeman  is a 80 y.o. Caucasian male with a known history of asthma, coronary artery disease, type 2 diabetes mellitus, gout, hypertension and dyslipidemia who presented to the emergency room with acute onset of neck pain mainly on the left side.  Per patient wife, patient had a fall 5 -6 days ago, he fell on his right sided chest.  He has been having pain in the chest since then.  Started neck pain 2 days ago. He had a C-spine CT scan did not show any fracture.  He was admitted to the hospital for pain control.  8/30.  Patient also had MRI of the cervical spine, showed C4 subdural hematoma.  Has been seen by neurosurgery.  Continue physical therapy/Occupational Therapy.   Patient was also placed on 4 L oxygen since admission.  Per wife, patient has worsening short of breath with exertion for the past year, recently getting worse.  He also had a significant cough, nonproductive.  He did not have any paroxysmal nocturnal dyspnea or orthopnea, he has significant insomnia.  No leg edema.  He is treated for COPD exacerbation with steroids and Zithromax.  Also maximize COPD treatment.   Assessment & Plan:   Active Problems:   Neck pain   Acute hypoxemic respiratory failure (HCC)  #1.  Neck pain with C4 subdural hematoma. Secondary to fall.  Appreciate neurosurgery consult.  Continue symptomatic treatment with pain medicine, also add Lidoderm patch to the neck.  #2.  Acute hypoxemic respiratory failure.  Most likely secondary to COPD exacerbation. Patient does not appear to have any volume overload to suggest congestive heart failure.  Obtain BNP for volume status.  Continue oxygen treatment for now.  Wean oxygen as needed.  Also start incentive spirometer.  3.  COPD exacerbation. Patient does have  worsening short of breath and cough recently.  Had a history of COPD.  We will maximize COPD treatment.  Also started oral steroids and at this Flomax.  Continue as needed albuterol.  4.  Type 2 diabetes. Continue sliding scale insulin.  5.  Chronic kidney disease stage IIIa.  Secondary to type 2 diabetes. Review the patient previous labs, patient renal function still stable compared to labs performed last year.  Continue to follow.  6.  Essential hypertension. Continue home medicines.  7.  Frequent falls. Obtain physical therapy/Occupational Therapy.  Most likely will send therapy to patient home.  DVT prophylaxis: SCDs Code Status: Full Family Communication: Wife at bedside. Disposition Plan:  . Patient came from: Home            . Anticipated d/c place: Home . Barriers to d/c OR conditions which need to be met to effect a safe d/c:   Consultants:   Neurosurgery  Procedures:None Antimicrobials: Zithromax  Subjective: Patient is placed on 4 L oxygen overnight, he has short of breath with exertion.  He has chronic insomnia, but does not have paroxysmal nocturnal dyspnea.  He has a cough, nonproductive. No leg edema. No nausea vomiting or diarrhea.  No abdominal pain. No fever or chills. No dysuria hematuria.  Objective: Vitals:   10/11/19 0021 10/11/19 0555 10/11/19 0802 10/11/19 1215  BP: (!) 149/84 (!) 147/83  128/83  Pulse: (!) 102 93  (!) 104  Resp: _0 Temp: 98.8  F (37.1 C) 98.6 F (37 C)  99.8 F (37.7 C)  TempSrc: Oral Oral    SpO2: 91% 92% 93% 90%  Weight: 96.6 kg     Height: _0  (1.651 m)       Intake/Output Summary (Last 24 hours) at 10/11/2019 1329 Last data filed at 10/11/2019 0310 Gross per 24 hour  Intake 2120 ml  Output 250 ml  Net 1870 ml   Filed Weights   10/09/19 2357 10/11/19 0021  Weight: 87.1 kg 96.6 kg    Examination:  General exam: Appears calm and comfortable  Respiratory system: Significant decreased breathing  sounds without crackles or wheezes.Marland Kitchen Respiratory effort normal. Cardiovascular system: S1 & S2 heard, RRR. No JVD, murmurs, rubs, gallops or clicks. No pedal edema. Gastrointestinal system: Abdomen is nondistended, soft and nontender. No organomegaly or masses felt. Normal bowel sounds heard. Central nervous system: Alert and oriented. No focal neurological deficits. Extremities: Symmetric  Skin: No rashes, lesions or ulcers Psychiatry:  Mood & affect appropriate.  Left sided neck tenderness    Data Reviewed: I have personally reviewed following labs and imaging studies  CBC: Recent Labs  Lab 10/10/19 0001 10/10/19 0432  WBC 10.6* 15.4*  NEUTROABS 8.9*  --   HGB 13.9 12.1*  HCT 40.7 37.6*  MCV 90.0 93.1  PLT 196 568   Basic Metabolic Panel: Recent Labs  Lab 10/10/19 0001 10/10/19 0432  NA 138 137  K 4.1 4.1  CL 105 105  CO2 22 23  GLUCOSE 194* 209*  BUN 20 17  CREATININE 1.43* 1.33*  CALCIUM 9.0 8.0*   GFR: Estimated Creatinine Clearance: 49.7 mL/min (A) (by C-G formula based on SCr of 1.33 mg/dL (H)). Liver Function Tests: No results for input(s): AST, ALT, ALKPHOS, BILITOT, PROT, ALBUMIN in the last 168 hours. No results for input(s): LIPASE, AMYLASE in the last 168 hours. No results for input(s): AMMONIA in the last 168 hours. Coagulation Profile: No results for input(s): INR, PROTIME in the last 168 hours. Cardiac Enzymes: No results for input(s): CKTOTAL, CKMB, CKMBINDEX, TROPONINI in the last 168 hours. BNP (last 3 results) No results for input(s): PROBNP in the last 8760 hours. HbA1C: Recent Labs    10/10/19 0432  HGBA1C 6.6*   CBG: Recent Labs  Lab 10/10/19 1351 10/10/19 2220 10/11/19 0809 10/11/19 1215  GLUCAP 166* 160* 113* 150*   Lipid Profile: No results for input(s): CHOL, HDL, LDLCALC, TRIG, CHOLHDL, LDLDIRECT in the last 72 hours. Thyroid Function Tests: No results for input(s): TSH, T4TOTAL, FREET4, T3FREE, THYROIDAB in the last 72  hours. Anemia Panel: No results for input(s): VITAMINB12, FOLATE, FERRITIN, TIBC, IRON, RETICCTPCT in the last 72 hours. Sepsis Labs: No results for input(s): PROCALCITON, LATICACIDVEN in the last 168 hours.  Recent Results (from the past 240 hour(s))  SARS Coronavirus 2 by RT PCR (hospital order, performed in [facility] hospital lab) Nasopharyngeal Nasopharyngeal Swab     Status: None   Collection Time: 10/10/19  2:07 AM   Specimen: Nasopharyngeal Swab  Result Value Ref Range Status   SARS Coronavirus 2 NEGATIVE NEGATIVE Final    Comment: (NOTE) SARS-CoV-2 target nucleic acids are NOT DETECTED.  The SARS-CoV-2 RNA is generally detectable in upper and lower respiratory specimens during the acute phase of infection. The lowest concentration of SARS-CoV-2 viral copies this assay can detect is 250 copies / mL. A negative result does not preclude SARS-CoV-2 infection and should not be used as the sole basis for treatment or other  patient management decisions.  A negative result may occur with improper specimen collection / handling, submission of specimen other than nasopharyngeal swab, presence of viral mutation(s) within the areas targeted by this assay, and inadequate number of viral copies (<250 copies / mL). A negative result must be combined with clinical observations, patient history, and epidemiological information.  Fact Sheet for Patients:   StrictlyIdeas.no  Fact Sheet for Healthcare Providers: BankingDealers.co.za  This test is not yet approved or  cleared by the [place] FDA and has been authorized for detection and/or diagnosis of SARS-CoV-2 by FDA under an Emergency Use Authorization (EUA).  This EUA will remain in effect (meaning this test can be used) for the duration of the COVID-19 declaration under Section 564(b)(1) of the Act, 21 U.S.C. section 360bbb-3(b)(1), unless the authorization is terminated or revoked  sooner.  Performed at [facility], [address], [place], [place] [address]          Radiology Studies: CT Angio Neck W and/or Wo Contrast  Result Date: 10/10/2019 CLINICAL DATA:  Initial evaluation for acute left-sided neck pain, recent fall. EXAM: CT ANGIOGRAPHY NECK TECHNIQUE: Multidetector CT imaging of the neck was performed using the standard protocol during bolus administration of intravenous contrast. Multiplanar CT image reconstructions and MIPs were obtained to evaluate the vascular anatomy. Carotid stenosis measurements (when applicable) are obtained utilizing NASCET criteria, using the distal internal carotid diameter as the denominator. CONTRAST:  149m OMNIPAQUE IOHEXOL 350 MG/ML SOLN COMPARISON:  Comparison made with concomitant CT of the cervical spine. FINDINGS: Aortic arch: Visualized aortic arch of normal caliber with normal branch pattern. Mild-to-moderate atheromatous change about the arch and origin of the great vessels without hemodynamically significant stenosis. Evaluation of the proximal left common and subclavian arteries limited by streak artifact from adjacent venous contamination. Right carotid system: Right CCA patent from its origin to the bifurcation without stenosis. Scattered mixed plaque about the right bifurcation/proximal right ICA with associated stenosis of up to 40% by NASCET criteria. Right ICA patent distally to the skull base without stenosis, dissection or occlusion. Prominent atherosclerotic change noted within the visualized right carotid siphon. Right external carotid artery and its branches intact without abnormality. Left carotid system: Evaluation of the proximal left CCA limited by adjacent venous contamination. Visualized portions of the left CCA patent without abnormality. A centric plaque at the left bifurcation/proximal left ICA with no more than mild 25% stenosis by NASCET criteria. Left ICA patent distally to the skull base without  stenosis, dissection or occlusion. Prominent atherosclerotic change noted within the visualized left carotid siphon. Left external carotid artery and its branches intact without abnormality. Vertebral arteries: Both vertebral arteries arise from the subclavian arteries. No appreciable proximal subclavian artery stenosis. Vertebral arteries patent within the neck without stenosis, dissection or occlusion. Skeleton: Osseous structures better evaluated on concomitant CT of the cervical spine. No definite acute osseous abnormality. Severe multilevel cervical spondylosis and facet arthrosis noted. No discrete or worrisome osseous lesions. Other neck: No other acute soft tissue abnormality within the neck. No mass lesion or adenopathy. Upper chest: Visualized upper chest better evaluated on concomitant CT of the chest. No definite acute abnormality. IMPRESSION: 1. Negative CTA of the neck with no evidence for acute traumatic vascular injury to the major arterial vasculature of the neck. 2. Atheromatous plaque about the carotid bifurcations/proximal ICAs bilaterally, with associated stenoses of up to 40% on the right and mild 25% on the left. 3. Severe multilevel cervical spondylosis and facet arthrosis,  better evaluated on concomitant CT of the cervical spine. Electronically Signed   By: Jeannine Boga M.D.   On: 10/10/2019 01:26   MR CERVICAL SPINE WO CONTRAST  Result Date: 10/10/2019 CLINICAL DATA:  Acute left-sided neck pain after fall 2 days ago. EXAM: MRI CERVICAL SPINE WITHOUT CONTRAST TECHNIQUE: Multiplanar, multisequence MR imaging of the cervical spine was performed. No intravenous contrast was administered. COMPARISON:  CT cervical spine same day. FINDINGS: Alignment: Straightening of the normal cervical lordosis. Trace degenerative anterolisthesis at C7-T1. Vertebrae: No fracture, evidence of discitis, or bone lesion. Cord: Normal signal and morphology. Thin asymmetric heterogeneous fluid signal in  the left posterolateral epidural space at C4 (series 8, image 9). Posterior Fossa, vertebral arteries, paraspinal tissues: Small amount of prevertebral edema. Upper cervical paraspinous soft tissue and muscle edema, greater on the left. Disc levels: C2-C3: Small right-sided posterior disc osteophyte complex. Severe right facet arthropathy. Moderate right neuroforaminal stenosis. No spinal canal or left neuroforaminal stenosis. C3-C4: Mild disc bulging. Severe left facet uncovertebral hypertrophy with small left facet joint effusion. Mild spinal canal stenosis. Severe left neuroforaminal stenosis. No right neuroforaminal stenosis. C4-C5: Interbody and facet ankylosis. Severe bilateral neuroforaminal stenosis. No spinal canal stenosis. C5-C6: Bulky posterior disc osteophyte complex. Severe bilateral facet uncovertebral hypertrophy. Moderate to severe spinal canal stenosis. Severe bilateral neuroforaminal stenosis. C6-C7: Interbody and facet ankylosis. Posterior endplate and uncovertebral spurring. Moderate spinal canal stenosis. Severe left and moderate right neuroforaminal stenosis. C7-T1: Negative disc. Moderate left greater than right facet uncovertebral hypertrophy. Severe bilateral neuroforaminal stenosis. No spinal stenosis. T1-T2: Negative disc.  Mild left facet arthropathy.  No stenosis. IMPRESSION: 1. No acute fracture or cord injury. 2. Thin asymmetric heterogeneous fluid signal in the left posterolateral epidural space at C4 may represent a small amount of epidural hematoma. Small amount of prevertebral edema. 3. Upper cervical paraspinous soft tissue and muscle edema, greater on the left. 4. Multilevel cervical spondylosis as described above. Moderate to severe spinal canal stenosis at C5-C6. Multilevel severe neuroforaminal stenosis. Electronically Signed   By: Titus Dubin M.D.   On: 10/10/2019 13:14   CT C-SPINE NO CHARGE  Result Date: 10/10/2019 CLINICAL DATA:  Neck pain EXAM: CT CERVICAL SPINE  WITHOUT CONTRAST TECHNIQUE: Multiplanar CT image reconstructions were generated from previously performed CT angiography of the neck. No additional contrast was administered COMPARISON:  None. FINDINGS: Alignment: Straightening of the cervical spine. Trace anterolisthesis C7 on T1. Facet alignment is maintained. Skull base and vertebrae: No fracture is visualized. Soft tissues and spinal canal: No prevertebral fluid or swelling. No visible canal hematoma. Disc levels: Multiple level degenerative change. Severe degenerative changes C4 through C7 with bony ankylosis at these levels. Bulky anterior osteophytes at C5-C6. Effacement of the disc spaces. Posterior disc osteophyte at multiple levels. Advanced facet degenerative change throughout the cervical spine with multiple level bilateral foraminal stenosis, severe at C4-C5, C5-C6 and C6-C7. irregular facet degenerative change on the left at C3-C4. Upper chest: Negative. Other: None IMPRESSION: No definite acute fracture identified. Straightening of the cervical spine with severe degenerative changes from C4 through C7, including marked irregular facet arthropathy on the left side at C3-C4. MRI follow-up may be obtained depending upon the clinical course. See separately dictated CT angiography report for findings regarding the neck vasculature. Electronically Signed   By: Donavan Foil M.D.   On: 10/10/2019 01:14   CT Angio Chest/Abd/Pel for Dissection W and/or Wo Contrast  Result Date: 10/10/2019 CLINICAL DATA:  79 year old male with fall and right  chest pain and back pain. EXAM: CT ANGIOGRAPHY CHEST, ABDOMEN AND PELVIS TECHNIQUE: Non-contrast CT of the chest was initially obtained. Multidetector CT imaging through the chest, abdomen and pelvis was performed using the standard protocol during bolus administration of intravenous contrast. Multiplanar reconstructed images and MIPs were obtained and reviewed to evaluate the vascular anatomy. CONTRAST:  157mL  OMNIPAQUE IOHEXOL 350 MG/ML SOLN COMPARISON:  Chest CT dated 01/27/2015. FINDINGS: Evaluation is limited due to streak artifact caused by patient's arms. CTA CHEST FINDINGS Cardiovascular: There is no cardiomegaly or pericardial effusion. There is coronary vascular calcification of the LAD and RCA. There is mild atherosclerotic calcification of the thoracic aorta. No aneurysmal dilatation or dissection. The central pulmonary arteries appear patent. Mediastinum/Nodes: No hilar or mediastinal adenopathy. The esophagus is grossly unremarkable. No mediastinal fluid collection. Lungs/Pleura: Mild emphysema. There are minimal bibasilar dependent atelectasis. No focal consolidation, pleural effusion, or pneumothorax. The central airways are patent Musculoskeletal: Bilateral short L arthroplasties with associated streak artifact. Degenerative changes of the spine. No acute osseous pathology. Review of the MIP images confirms the above findings. CTA ABDOMEN AND PELVIS FINDINGS VASCULAR Aorta: Moderate atherosclerotic calcification of the aorta. There is a 3 cm infrarenal abdominal aortic aneurysm. No dissection. No periaortic inflammation. Celiac: Patent without evidence of aneurysm, dissection, vasculitis or significant stenosis. SMA: Patent without evidence of aneurysm, dissection, vasculitis or significant stenosis. Renals: Both renal arteries are patent without evidence of aneurysm, dissection, vasculitis, fibromuscular dysplasia or significant stenosis. IMA: Patent without evidence of aneurysm, dissection, vasculitis or significant stenosis. Inflow: Atherosclerotic calcification of the iliac arteries. No aneurysmal dilatation or dissection. Veins: No obvious venous abnormality within the limitations of this arterial phase study. Review of the MIP images confirms the above findings. NON-VASCULAR No intra-abdominal free air or free fluid. Hepatobiliary: No focal liver abnormality is seen. No gallstones, gallbladder wall  thickening, or biliary dilatation. Pancreas: Unremarkable. No pancreatic ductal dilatation or surrounding inflammatory changes. Spleen: Normal in size without focal abnormality. Adrenals/Urinary Tract: The adrenal glands unremarkable. Moderate bilateral renal parenchyma atrophy. There is no hydronephrosis on either side. The visualized ureters appear unremarkable. Mildly trabeculated appearance of the bladder wall, likely related to chronic bladder outlet obstruction. Stomach/Bowel: There is sigmoid diverticulosis without active inflammatory changes. There is no bowel obstruction or active inflammation. Appendectomy. Lymphatic: No adenopathy. Reproductive: Enlarged prostate gland with median lobe hypertrophy measuring approximately 5 cm in transverse diameter. Other: Small fat containing umbilical hernia. Musculoskeletal: Osteopenia with degenerative changes of the spine. No acute osseous pathology. Review of the MIP images confirms the above findings. IMPRESSION: 1. No acute intrathoracic, abdominal, or pelvic pathology. No CT evidence of aortic dissection. 2. A 3 cm infrarenal abdominal aortic aneurysm. Recommend follow-up every 3 years. This recommendation follows ACR consensus guidelines: White Paper of the ACR Incidental Findings Committee II on Vascular Findings. J Am Coll Radiol 2013; 10:789-794. 3. Sigmoid diverticulosis. No bowel obstruction. 4. 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 5. Aortic Atherosclerosis (ICD10-I70.0) and Emphysema (ICD10-J43.9). Electronically Signed   By: Anner Crete M.D.   On: 10/10/2019 01:10        Scheduled Meds: . allopurinol  300 mg Oral Daily  . amLODipine  5 mg Oral Daily  . aspirin EC  325 mg Oral Daily  . azithromycin  500 mg Oral Daily  . escitalopram  10 mg Oral Daily  . finasteride  5 mg Oral Daily  . hydroxychloroquine  200 mg Oral Daily  . insulin aspart  0-15 Units Subcutaneous TID  WC  . insulin aspart  0-5 Units Subcutaneous QHS    . leflunomide  10 mg Oral Daily  . lidocaine  1 patch Transdermal Q24H  . melatonin  5 mg Oral QHS  . mometasone-formoterol  2 puff Inhalation BID  . montelukast  10 mg Oral QHS  . predniSONE  40 mg Oral Q breakfast  . rosuvastatin  20 mg Oral Daily  . tamsulosin  0.8 mg Oral Daily  . tiotropium  18 mcg Inhalation Daily   Continuous Infusions:   LOS: 0 days    Time spent: 35 minutes    Sharen Hones, MD [facility]   To contact the attending provider between 7A-7P or the covering provider during after hours 7P-7A, please log into the web site [email or web address] and access using universal [place] password for that web site. If you do not have the password, please call the hospital operator.  10/11/2019, 1:29 PM

## 2019-10-11 NOTE — Progress Notes (Signed)
Physical Therapy Treatment Patient Details Name: Jared Freeman MRN: [id number] DOB: September 16, 1940 Today's Date: 10/11/2019    History of Present Illness Per MD note:Jared Freeman  is a 79 y.o. Caucasian male with a known history of asthma, coronary artery disease, type 2 diabetes mellitus, gout, hypertension and dyslipidemia who presented to the emergency room with acute onset of neck pain mainly on the left side.  He had an accidental mechanical fall 2 days ago at [facility] and his wife believes that he may have twisted his neck.  He stated that he fell on the right hand and hurt his right chest wall.  He denied any head injuries paresthesias or focal muscle weakness during or after the fall.  No headache or dizziness or blurred vision or presyncope.  No nausea or vomiting or abdominal pain.  His wife stated that he has been having recurrent falls throughout the summer due to loss of balance.  He denies chest pain or palpitations.  No cough or wheezing or hemoptysis.  He has been vaccinated for COVID-19.    PT Comments    Pt received in supine position upon arrival to room.  Pt was slid down in bed and nasal cannula was on floor.  Pt SpO2 was 93% upon arrival to room.  Once exercises were attempted, pt desaturate to 84% on room air.  Pt nasal cannula was replaced and pt was able to get SpO2 back to 94% fairly quickly.  Pt experiences decline in O2 level with supine exercises.  Pt was educated on proper breathing technique through nose and out through mouth but did not have good carryover during exercises and required frequent cuing throughout session.  Pt attempted to slide up in bed with verbal cuing but had difficulty due to neck pain.  Another therapist assisted in sliding pt up towards [facility] for pain relief.  Pt will benefit from PT services in a SNF setting upon discharge to safely address deficits listed in patient problem list for decreased caregiver assistance and eventual return to  [facility].     Follow Up Recommendations  SNF     Equipment Recommendations  Rolling walker with 5" wheels    Recommendations for Other Services       Precautions / Restrictions Precautions Precautions: Fall Restrictions Weight Bearing Restrictions: No    Mobility  Bed Mobility Overal bed mobility: Needs Assistance             General bed mobility comments: Pt desat to 84 on room air when performing exercises in supine position.  Pt's nasal cannula was replaced and pt was able to get SpO2 back to 94%.  Bed mobility not performed due to desat with any movement and will be attempted at a later time.  Transfers                    Ambulation/Gait                 Stairs             Wheelchair Mobility    Modified Rankin (Stroke Patients Only)       Balance                                            Cognition Arousal/Alertness: Awake/alert Behavior During Therapy: Restless Overall Cognitive Status: Within Functional Limits for tasks assessed  General Comments: Pt responds to exercises and performs them, but is very fidgety and moves somewhat sporadically.      Exercises Total Joint Exercises Ankle Circles/Pumps: AROM;Strengthening;Both;10 reps;Supine Quad Sets: AROM;Strengthening;Both;10 reps;Supine Gluteal Sets: AROM;Strengthening;Both;10 reps;Supine Short Arc Quad: AROM;Strengthening;Both;10 reps;Supine Heel Slides: AROM;Strengthening;Both;10 reps;Supine Hip ABduction/ADduction: AROM;Strengthening;Both;10 reps;Supine Straight Leg Raises: AROM;Strengthening;Both;10 reps;Supine    General Comments        Pertinent Vitals/Pain Pain Assessment: 0-10 Pain Score: 8  Pain Descriptors / Indicators: Grimacing;Moaning;Restless Pain Intervention(s): Limited activity within patient's tolerance;Monitored during session;Repositioned    Home Living                       Prior Function            PT Goals (current goals can now be found in the care plan section) Acute Rehab PT Goals Patient Stated Goal: to have no pain PT Goal Formulation: Patient unable to participate in goal setting Time For Goal Achievement: 10/24/19 Potential to Achieve Goals: Fair Progress towards PT goals: Progressing toward goals    Frequency    Min 2X/week      PT Plan      Co-evaluation              AM-PAC PT "6 Clicks" Mobility   Outcome Measure  Help needed turning from your back to your side while in a flat bed without using bedrails?: A Little Help needed moving from lying on your back to sitting on the side of a flat bed without using bedrails?: A Little Help needed moving to and from a bed to a chair (including a wheelchair)?: Total Help needed standing up from a chair using your arms (e.g., wheelchair or bedside chair)?: Total Help needed to walk in hospital room?: Total Help needed climbing 3-5 steps with a railing? : Total 6 Click Score: 10    End of Session Equipment Utilized During Treatment: Oxygen Activity Tolerance: Patient limited by pain Patient left: in bed;with bed alarm set;with family/visitor present Nurse Communication:  (unable to find pateints phone-nsg aware) PT Visit Diagnosis: Pain Pain - part of body:  (neck)     Time: 2426-8341 PT Time Calculation (min) (ACUTE ONLY): 23 min  Charges:  $Therapeutic Exercise: 23-37 mins                   Gwenlyn Saran, PT, DPT 10/11/19, 12:44 PM

## 2019-10-11 NOTE — NC FL2 (Signed)
[place] LEVEL OF CARE SCREENING TOOL     IDENTIFICATION  Patient Name: Jared Freeman Birthdate: Jul 06, 1940 Sex: male Admission Date (Current Location): 10/09/2019  [facility] and [place] Number:  Engineering geologist and Address:         Provider Number: [id number]  Attending Physician Name and Address:  Sharen Hones, MD  Relative Name and Phone Number:       Current Level of Care: Hospital Recommended Level of Care: [place] Prior Approval Number:    Date Approved/Denied:   PASRR Number: [id number]  Discharge Plan: SNF    Current Diagnoses: Patient Active Problem List   Diagnosis Date Noted  . Acute hypoxemic respiratory failure ([place]) 10/11/2019  . Subdural hematoma ([place]) 10/11/2019  . COPD with acute exacerbation ([place]) 10/11/2019  . Neck pain 10/10/2019  . 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 Hyperlipidemia 11/28/2017  . Asthma without status asthmaticus 11/28/2017  . Status post reverse arthroplasty of shoulder, left 11/11/2017  . Rotator cuff tendinitis, left 10/27/2017  . 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 GAD (generalized anxiety disorder) 08/24/2015  . Rotator cuff tear arthropathy of right shoulder 09/30/2014  . Sleep apnea 08/27/2013  . Urinary outflow obstruction 08/27/2013  . Obesity 08/27/2013  . Hypertension 08/27/2013  . Hx of adenomatous colonic polyps 08/27/2013  . History of ASCVD (atherosclerotic cardiovascular disease) 08/27/2013  . H/O hyperglycemia 08/27/2013  . H/O cardiac catheterization 08/27/2013  . Gout 08/27/2013  . Frequent PVCs 08/27/2013  . Diet-controlled type 2 diabetes mellitus ([place]) 08/27/2013  . Bilateral claudication of lower limb ([place]) 08/27/2013  . 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 Malignant neoplasm of penis ([place]) 01/01/2013    Orientation RESPIRATION BLADDER Height & Weight      Self, Time, Situation, Place  O2 (4L [place]) Continent Weight: 96.6 kg Height:  5\' 5"  (165.1 cm)  BEHAVIORAL SYMPTOMS/MOOD NEUROLOGICAL BOWEL NUTRITION STATUS      Continent Diet (Heart Healthy)  AMBULATORY STATUS COMMUNICATION OF NEEDS Skin   Extensive Assist Verbally Normal                       Personal Care Assistance Level of Assistance              Functional Limitations Info             SPECIAL CARE FACTORS FREQUENCY  OT (By licensed OT), Bowel and bladder program                    Contractures Contractures Info: Not present    Additional Factors Info  Code Status, Allergies Code Status Info: Full Allergies Info: NKDA           Current Medications (10/11/2019):  This is the current hospital active medication list Current Facility-Administered Medications  Medication Dose Route Frequency Provider Last Rate Last Admin  . acetaminophen (TYLENOL) tablet 650 mg  650 mg Oral Q6H PRN Mansy, Jan A, MD   650 mg at 10/11/19 2683   Or  . acetaminophen (TYLENOL) suppository 650 mg  650 mg Rectal Q6H PRN Mansy, Jan A, MD      . albuterol (PROVENTIL) (2.5 MG/3ML) 0.083% nebulizer solution 2.5 mg  2.5 mg Nebulization Q6H PRN Kathie Dike, MD   2.5 mg at 10/11/19 0801  . allopurinol (ZYLOPRIM) tablet 300 mg  300 mg Oral Daily Mansy, Arvella Merles, MD  300 mg at 10/11/19 0926  . amLODipine (NORVASC) tablet 5 mg  5 mg Oral Daily Mansy, Jan A, MD   5 mg at 10/11/19 4742  . aspirin EC tablet 325 mg  325 mg Oral Daily Mansy, Jan A, MD   325 mg at 10/11/19 5956  . azithromycin (ZITHROMAX) tablet 500 mg  500 mg Oral Daily Sharen Hones, MD   500 mg at 10/11/19 1508  . cyclobenzaprine (FLEXERIL) tablet 10 mg  10 mg Oral TID PRN Mansy, Arvella Merles, MD   10 mg at 10/11/19 0659  . escitalopram (LEXAPRO) tablet 10 mg  10 mg Oral Daily Mansy, Jan A, MD   10 mg at 10/11/19 3875  . finasteride (PROSCAR) tablet 5 mg  5 mg Oral Daily Mansy, Jan A, MD   5 mg at 10/11/19 6433  .  hydroxychloroquine (PLAQUENIL) tablet 200 mg  200 mg Oral Daily Mansy, Jan A, MD   200 mg at 10/11/19 2951  . insulin aspart (novoLOG) injection 0-15 Units  0-15 Units Subcutaneous TID WC Kathie Dike, MD   3 Units at 10/11/19 1234  . insulin aspart (novoLOG) injection 0-5 Units  0-5 Units Subcutaneous QHS Kathie Dike, MD      . leflunomide (ARAVA) tablet 10 mg  10 mg Oral Daily Mansy, Jan A, MD   10 mg at 10/11/19 8841  . lidocaine (LIDODERM) 5 % 1 patch  1 patch Transdermal Q24H Sharen Hones, MD   1 patch at 10/11/19 1507  . magnesium hydroxide (MILK OF MAGNESIA) suspension 30 mL  30 mL Oral Daily PRN Mansy, Jan A, MD      . melatonin tablet 5 mg  5 mg Oral QHS Sharen Hones, MD      . mometasone-formoterol [facility]) 200-5 MCG/ACT inhaler 2 puff  2 puff Inhalation BID Sharen Hones, MD      . montelukast (SINGULAIR) tablet 10 mg  10 mg Oral QHS Mansy, Jan A, MD   10 mg at 10/10/19 2217  . ondansetron (ZOFRAN) tablet 4 mg  4 mg Oral Q6H PRN Mansy, Jan A, MD       Or  . ondansetron [facility]) injection 4 mg  4 mg Intravenous Q6H PRN Mansy, Jan A, MD      . oxyCODONE-acetaminophen (PERCOCET/ROXICET) 5-325 MG per tablet 1 tablet  1 tablet Oral Q4H PRN Sharen Hones, MD      . predniSONE (DELTASONE) tablet 40 mg  40 mg Oral Q breakfast Sharen Hones, MD   40 mg at 10/11/19 1508  . rosuvastatin (CRESTOR) tablet 20 mg  20 mg Oral Daily Mansy, Jan A, MD      . tamsulosin [facility]) capsule 0.8 mg  0.8 mg Oral Daily Mansy, Jan A, MD   0.8 mg at 10/11/19 6606  . tiotropium (SPIRIVA) inhalation capsule ([facility] use ONLY) 18 mcg  18 mcg Inhalation Daily Sharen Hones, MD   18 mcg at 10/11/19 1508     Discharge Medications: Please see discharge summary for a list of discharge medications.  Relevant Imaging Results:  Relevant Lab Results:   Additional Information [id number]  Beverly Sessions, RN

## 2019-10-12 ENCOUNTER — Encounter: Payer: Self-pay | Admitting: Urology

## 2019-10-12 ENCOUNTER — Inpatient Hospital Stay
Admit: 2019-10-12 | Discharge: 2019-10-12 | Disposition: A | Discharge status: Home / Self Care | Payer: Medicare Other | Attending: Internal Medicine | Admitting: Internal Medicine

## 2019-10-12 DIAGNOSIS — I5033 Acute on chronic diastolic (congestive) heart failure: Secondary | ICD-10-CM

## 2019-10-12 LAB — GLUCOSE, CAPILLARY
Glucose-Capillary: 120 mg/dL — ABNORMAL HIGH (ref 70–99)
Glucose-Capillary: 130 mg/dL — ABNORMAL HIGH (ref 70–99)
Glucose-Capillary: 155 mg/dL — ABNORMAL HIGH (ref 70–99)
Glucose-Capillary: 122 mg/dL — ABNORMAL HIGH (ref 70–99)

## 2019-10-12 LAB — ECHOCARDIOGRAM COMPLETE
AR max vel: 2.24 cm2
AV Area VTI: 2.77 cm2
AV Area mean vel: 2.03 cm2
AV Mean grad: 4 mmHg
AV Peak grad: 7.1 mmHg
Ao pk vel: 1.34 m/s
Area-P 1/2: 4.19 cm2
Height: 65 in
S' Lateral: 3.11 cm
Weight: 3407.43 oz

## 2019-10-12 MED ORDER — POLYETHYLENE GLYCOL 3350 17 G PO PACK: 17.0000 g | PACK | Freq: Every day | ORAL | Status: DC

## 2019-10-12 MED ORDER — FUROSEMIDE 10 MG/ML IJ SOLN
40.0000 mg | Freq: Once | INTRAMUSCULAR | Status: AC
  Administered 2019-10-12: 40 mg via INTRAVENOUS
  Filled 2019-10-12: qty 4

## 2019-10-12 MED ORDER — SENNOSIDES-DOCUSATE SODIUM 8.6-50 MG PO TABS
2.0000 | ORAL_TABLET | Freq: Two times a day (BID) | ORAL | Status: DC
  Administered 2019-10-12 – 2019-10-13 (×3): 2 via ORAL
  Filled 2019-10-12 (×3): qty 2

## 2019-10-12 NOTE — TOC Transition Note (Signed)
Transition of Care [facility]) - CM/SW Discharge Note   Patient Details  Name: Jared Freeman MRN: [id number] Date of Birth: 1940/11/24  Transition of [facility] CM/SW Contact:  Beverly Sessions, RN Phone Number: 10/12/2019, 4:02 PM   Clinical Narrative:     TOC reached out to all facilities that have offered a bed. - [place] - can waive 3 night medicare stay. - Private room - [facility]- Can waive 3 night medicare stay - semi private room - [facility]- can waive 3 night stay - private room - [facility] waive 3 night stay - semi private room - [facility] - can not waive 3 night stay  Bed offers presented.  [place] selected.   Accepted in [place].  Confirmed with Lavella Lemons that 3 night medicare stay will be waived, and patient will be in a private room.         Patient Goals and CMS Choice        Discharge Placement                       Discharge Plan and Services   Discharge Planning Services: CM Consult                                 Social Determinants of Health (SDOH) Interventions     Readmission Risk Interventions No flowsheet data found.

## 2019-10-12 NOTE — Progress Notes (Signed)
PT Cancellation Note  Patient Details Name: Jared Freeman MRN: [id number] DOB: Jun 12, 1940   Cancelled Treatment:    Reason Eval/Treat Not Completed: Other (comment) Chart reviewed, spoke with nurse who reports pt is doing much better and now on room air.  PT knocked and opened door to find what seemed to be a Reverend (?) and another guest was in the room crying and appearing distraught.  PT made eye contact and it seemed apparent that now was not a good time for therapy.  I did not press further and closed the door.  PT held this date.  Kreg Shropshire, DPT 10/12/2019, 5:26 PM

## 2019-10-12 NOTE — Progress Notes (Signed)
PROGRESS NOTE    Jared Freeman  TKZ:[id number] DOB: 1940-08-02 DOA: 10/09/2019 PCP: Idelle Crouch, MD   Chief complaint pain shortness of breath. Brief Narrative:  HiramNortonis T75 y.o.Caucasian malewith a known history of asthma, coronary artery disease, type 2 diabetes mellitus, gout, hypertension and dyslipidemia who presented to the emergency room with acute onset of neck pain mainly on the left side.  Per patient wife, patient had a fall 5 -6 days ago, he fell on his right sided chest.  He has been having pain in the chest since then.  Started neck pain 2 days ago. He had a C-spine CT scan did not show any fracture.  He was admitted to the hospital for pain control.  8/30.  Patient also had MRI of the cervical spine, showed C4 subdural hematoma.  Has been seen by neurosurgery.  Continue physical therapy/Occupational Therapy.   Patient was also placed on 4 L oxygen since admission.  Per wife, patient has worsening short of breath with exertion for the past year, recently getting worse.  He also had a significant cough, nonproductive.  He did not have any paroxysmal nocturnal dyspnea or orthopnea, he has significant insomnia.  No leg edema.  He is treated for COPD exacerbation with steroids and Zithromax.  Also maximize COPD treatment.  8/31.  Short of breath getting better, off oxygen.  Elevated BNP, obtain echocardiogram, Lasix 40 mg IV 1 dose.    Assessment & Plan:   Active Problems:   Neck pain   Acute hypoxemic respiratory failure (HCC)   Subdural hematoma (HCC)   COPD with acute exacerbation ([place])  #1.  Neck pain with a C4 subdural hematoma. Appreciated neurosurgery consult. Neck pain mainly due to muscular trauma, pain is better with Lidoderm patch.  2.  Acute hypoxemic respite failure secondary to COPD exacerbation. Condition improved.  3.  COPD exacerbation. Continue steroids and Zithromax. Condition much improved.  4.  Acute on chronic diastolic  congestive heart failure. Patient has elevated BNP, crackles in the base.  We will give a dose of IV Lasix.  Check echocardiogram for ejection fraction.  5.  Chronic kidney disease stage IIIa.  Secondary to type 2 diabetes. Condition stable.  6.  Type 2 diabetes. Continue sliding scale insulin.  7.  Essential hypertension. Continue home medicines.  8.  Frequent falls. Continue physical therapy/Occupational Therapy.  DVT prophylaxis: SCDs Code Status: Full Family Communication: Wife at bedside. Disposition Plan:   Patient came from: Home                                                                                                                           Anticipated d/c place: Home  Barriers to d/c OR conditions which need to be met to effect a safe d/c:   Consultants:   Neurosurgery  Procedures:None Antimicrobials: Zithromax   Subjective: Patient feels better with shortness of breath.  Able to walk a few feet with the help.  Off  oxygen. No abdominal pain or nausea vomiting. No fever or chills.  Objective: Vitals:   10/11/19 2033 10/12/19 0621 10/12/19 1132 10/12/19 1143  BP: 139/89 (!) 141/94 (!) 153/88 139/90  Pulse: 88 83 93 92  Resp: _0 Temp: 98 F (36.7 C) (!) 97.4 F (36.3 C)  97.7 F (36.5 C)  TempSrc:    Oral  SpO2: 94% 98% 92% 95%  Weight:      Height:        Intake/Output Summary (Last 24 hours) at 10/12/2019 1154 Last data filed at 10/11/2019 2339 Gross per 24 hour  Intake 240 ml  Output 400 ml  Net -160 ml   Filed Weights   10/09/19 2357 10/11/19 0021  Weight: 87.1 kg 96.6 kg    Examination:  General exam: Appears calm and comfortable  Respiratory system: More air movement today with crackles in bases Cardiovascular system: S1 & S2 heard, RRR. No JVD, murmurs, rubs, gallops or clicks. No pedal edema. Gastrointestinal system: Abdomen is nondistended, soft and nontender. No organomegaly or masses felt. Normal bowel sounds  heard. Central nervous system: Alert and oriented. No focal neurological deficits. Extremities: Symmetric  Skin: No rashes, lesions or ulcers Psychiatry: Judgement and insight appear normal. Mood & affect appropriate.     Data Reviewed: I have personally reviewed following labs and imaging studies  CBC: Recent Labs  Lab 10/10/19 0001 10/10/19 0432  WBC 10.6* 15.4*  NEUTROABS 8.9*  --   HGB 13.9 12.1*  HCT 40.7 37.6*  MCV 90.0 93.1  PLT 196 283   Basic Metabolic Panel: Recent Labs  Lab 10/10/19 0001 10/10/19 0432  NA 138 137  K 4.1 4.1  CL 105 105  CO2 22 23  GLUCOSE 194* 209*  BUN 20 17  CREATININE 1.43* 1.33*  CALCIUM 9.0 8.0*   GFR: Estimated Creatinine Clearance: 49.7 mL/min (A) (by C-G formula based on SCr of 1.33 mg/dL (H)). Liver Function Tests: No results for input(s): AST, ALT, ALKPHOS, BILITOT, PROT, ALBUMIN in the last 168 hours. No results for input(s): LIPASE, AMYLASE in the last 168 hours. No results for input(s): AMMONIA in the last 168 hours. Coagulation Profile: No results for input(s): INR, PROTIME in the last 168 hours. Cardiac Enzymes: No results for input(s): CKTOTAL, CKMB, CKMBINDEX, TROPONINI in the last 168 hours. BNP (last 3 results) No results for input(s): PROBNP in the last 8760 hours. HbA1C: Recent Labs    10/10/19 0432  HGBA1C 6.6*   CBG: Recent Labs  Lab 10/11/19 1215 10/11/19 1643 10/11/19 2035 10/12/19 0735 10/12/19 1140  GLUCAP 150* 151* 144* 120* 130*   Lipid Profile: No results for input(s): CHOL, HDL, LDLCALC, TRIG, CHOLHDL, LDLDIRECT in the last 72 hours. Thyroid Function Tests: No results for input(s): TSH, T4TOTAL, FREET4, T3FREE, THYROIDAB in the last 72 hours. Anemia Panel: No results for input(s): VITAMINB12, FOLATE, FERRITIN, TIBC, IRON, RETICCTPCT in the last 72 hours. Sepsis Labs: No results for input(s): PROCALCITON, LATICACIDVEN in the last 168 hours.  Recent Results (from the past 240 hour(s))    SARS Coronavirus 2 by RT PCR (hospital order, performed in [facility] hospital lab) Nasopharyngeal Nasopharyngeal Swab     Status: None   Collection Time: 10/10/19  2:07 AM   Specimen: Nasopharyngeal Swab  Result Value Ref Range Status   SARS Coronavirus 2 NEGATIVE NEGATIVE Final    Comment: (NOTE) SARS-CoV-2 target nucleic acids are NOT DETECTED.  The SARS-CoV-2 RNA is generally detectable in upper and lower  respiratory specimens during the acute phase of infection. The lowest concentration of SARS-CoV-2 viral copies this assay can detect is 250 copies / mL. A negative result does not preclude SARS-CoV-2 infection and should not be used as the sole basis for treatment or other patient management decisions.  A negative result may occur with improper specimen collection / handling, submission of specimen other than nasopharyngeal swab, presence of viral mutation(s) within the areas targeted by this assay, and inadequate number of viral copies (<250 copies / mL). A negative result must be combined with clinical observations, patient history, and epidemiological information.  Fact Sheet for Patients:   StrictlyIdeas.no  Fact Sheet for Healthcare Providers: BankingDealers.co.za  This test is not yet approved or  cleared by the [place] FDA and has been authorized for detection and/or diagnosis of SARS-CoV-2 by FDA under an Emergency Use Authorization (EUA).  This EUA will remain in effect (meaning this test can be used) for the duration of the COVID-19 declaration under Section 564(b)(1) of the Act, 21 U.S.C. section 360bbb-3(b)(1), unless the authorization is terminated or revoked sooner.  Performed at [facility], [address], [place],  [address]          Radiology Studies: MR CERVICAL SPINE WO CONTRAST  Result Date: 10/10/2019 CLINICAL DATA:  Acute left-sided neck pain after fall 2 days ago. EXAM:  MRI CERVICAL SPINE WITHOUT CONTRAST TECHNIQUE: Multiplanar, multisequence MR imaging of the cervical spine was performed. No intravenous contrast was administered. COMPARISON:  CT cervical spine same day. FINDINGS: Alignment: Straightening of the normal cervical lordosis. Trace degenerative anterolisthesis at C7-T1. Vertebrae: No fracture, evidence of discitis, or bone lesion. Cord: Normal signal and morphology. Thin asymmetric heterogeneous fluid signal in the left posterolateral epidural space at C4 (series 8, image 9). Posterior Fossa, vertebral arteries, paraspinal tissues: Small amount of prevertebral edema. Upper cervical paraspinous soft tissue and muscle edema, greater on the left. Disc levels: C2-C3: Small right-sided posterior disc osteophyte complex. Severe right facet arthropathy. Moderate right neuroforaminal stenosis. No spinal canal or left neuroforaminal stenosis. C3-C4: Mild disc bulging. Severe left facet uncovertebral hypertrophy with small left facet joint effusion. Mild spinal canal stenosis. Severe left neuroforaminal stenosis. No right neuroforaminal stenosis. C4-C5: Interbody and facet ankylosis. Severe bilateral neuroforaminal stenosis. No spinal canal stenosis. C5-C6: Bulky posterior disc osteophyte complex. Severe bilateral facet uncovertebral hypertrophy. Moderate to severe spinal canal stenosis. Severe bilateral neuroforaminal stenosis. C6-C7: Interbody and facet ankylosis. Posterior endplate and uncovertebral spurring. Moderate spinal canal stenosis. Severe left and moderate right neuroforaminal stenosis. C7-T1: Negative disc. Moderate left greater than right facet uncovertebral hypertrophy. Severe bilateral neuroforaminal stenosis. No spinal stenosis. T1-T2: Negative disc.  Mild left facet arthropathy.  No stenosis. IMPRESSION: 1. No acute fracture or cord injury. 2. Thin asymmetric heterogeneous fluid signal in the left posterolateral epidural space at C4 may represent a small amount  of epidural hematoma. Small amount of prevertebral edema. 3. Upper cervical paraspinous soft tissue and muscle edema, greater on the left. 4. Multilevel cervical spondylosis as described above. Moderate to severe spinal canal stenosis at C5-C6. Multilevel severe neuroforaminal stenosis. Electronically Signed   By: Titus Dubin M.D.   On: 10/10/2019 13:14        Scheduled Meds: . allopurinol  300 mg Oral Daily  . amLODipine  5 mg Oral Daily  . azithromycin  500 mg Oral Daily  . escitalopram  10 mg Oral Daily  . finasteride  5 mg Oral Daily  . furosemide  40  mg Intravenous Once  . hydroxychloroquine  200 mg Oral Daily  . insulin aspart  0-15 Units Subcutaneous TID WC  . insulin aspart  0-5 Units Subcutaneous QHS  . leflunomide  10 mg Oral Daily  . lidocaine  1 patch Transdermal Q24H  . melatonin  5 mg Oral QHS  . mometasone-formoterol  2 puff Inhalation BID  . montelukast  10 mg Oral QHS  . predniSONE  40 mg Oral Q breakfast  . rosuvastatin  20 mg Oral Daily  . tamsulosin  0.8 mg Oral Daily  . tiotropium  18 mcg Inhalation Daily   Continuous Infusions:   LOS: 1 day    Time spent: 25 minutes    Sharen Hones, MD [facility]   To contact the attending provider between 7A-7P or the covering provider during after hours 7P-7A, please log into the web site [email or web address] and access using universal [place] password for that web site. If you do not have the password, please call the hospital operator.  10/12/2019, 11:54 AM

## 2019-10-12 NOTE — Progress Notes (Signed)
*  PRELIMINARY RESULTS* Echocardiogram 2D Echocardiogram has been performed.  Jared Freeman 10/12/2019, 2:14 PM

## 2019-10-13 DIAGNOSIS — S065X9A Traumatic subdural hemorrhage with loss of consciousness of unspecified duration, initial encounter: Secondary | ICD-10-CM

## 2019-10-13 DIAGNOSIS — J441 Chronic obstructive pulmonary disease with (acute) exacerbation: Secondary | ICD-10-CM

## 2019-10-13 DIAGNOSIS — J9601 Acute respiratory failure with hypoxia: Secondary | ICD-10-CM

## 2019-10-13 LAB — BASIC METABOLIC PANEL
Anion gap: 12 (ref 5–15)
BUN: 37 mg/dL — ABNORMAL HIGH (ref 8–23)
CO2: 24 mmol/L (ref 22–32)
Calcium: 8.6 mg/dL — ABNORMAL LOW (ref 8.9–10.3)
Chloride: 102 mmol/L (ref 98–111)
Creatinine, Ser: 1.61 mg/dL — ABNORMAL HIGH (ref 0.61–1.24)
GFR calc Af Amer: 47 mL/min — ABNORMAL LOW (ref >60)
GFR calc non Af Amer: 41 mL/min — ABNORMAL LOW (ref >60)
Glucose, Bld: 118 mg/dL — ABNORMAL HIGH (ref 70–99)
Potassium: 4.1 mmol/L (ref 3.5–5.1)
Sodium: 138 mmol/L (ref 135–145)

## 2019-10-13 LAB — MAGNESIUM: Magnesium: 2.3 mg/dL (ref 1.7–2.4)

## 2019-10-13 LAB — CBC WITH DIFFERENTIAL/PLATELET
Abs Immature Granulocytes: 0.12 10*3/uL — ABNORMAL HIGH (ref 0.00–0.07)
Basophils Absolute: 0 10*3/uL (ref 0.0–0.1)
Basophils Relative: 0 %
Eosinophils Absolute: 0 10*3/uL (ref 0.0–0.5)
Eosinophils Relative: 0 %
HCT: 39.7 % (ref 39.0–52.0)
Hemoglobin: 13.2 g/dL (ref 13.0–17.0)
Immature Granulocytes: 1 %
Lymphocytes Relative: 6 %
Lymphs Abs: 0.8 10*3/uL (ref 0.7–4.0)
MCH: 30 pg (ref 26.0–34.0)
MCHC: 33.2 g/dL (ref 30.0–36.0)
MCV: 90.2 fL (ref 80.0–100.0)
Monocytes Absolute: 1 10*3/uL (ref 0.1–1.0)
Monocytes Relative: 8 %
Neutro Abs: 10.5 10*3/uL — ABNORMAL HIGH (ref 1.7–7.7)
Neutrophils Relative %: 85 %
Platelets: 198 10*3/uL (ref 150–400)
RBC: 4.4 MIL/uL (ref 4.22–5.81)
RDW: 14.1 % (ref 11.5–15.5)
WBC: 12.4 10*3/uL — ABNORMAL HIGH (ref 4.0–10.5)
nRBC: 0 % (ref 0.0–0.2)

## 2019-10-13 LAB — GLUCOSE, CAPILLARY
Glucose-Capillary: 137 mg/dL — ABNORMAL HIGH (ref 70–99)
Glucose-Capillary: 201 mg/dL — ABNORMAL HIGH (ref 70–99)

## 2019-10-13 MED ORDER — FINASTERIDE 5 MG PO TABS: 5.0000 mg | ORAL_TABLET | Freq: Every day | ORAL | 0 refills | Status: AC

## 2019-10-13 MED ORDER — AZITHROMYCIN 500 MG PO TABS: 500.0000 mg | ORAL_TABLET | Freq: Every day | ORAL | 0 refills | Status: AC

## 2019-10-13 MED ORDER — AMLODIPINE BESYLATE 5 MG PO TABS: 5.0000 mg | ORAL_TABLET | Freq: Every day | ORAL | 0 refills | Status: AC

## 2019-10-13 MED ORDER — TRAMADOL HCL 50 MG PO TABS
50.0000 mg | ORAL_TABLET | Freq: Four times a day (QID) | ORAL | Status: DC | PRN
  Administered 2019-10-13: 50 mg via ORAL
  Filled 2019-10-13: qty 1

## 2019-10-13 MED ORDER — METHYLPREDNISOLONE 4 MG PO TBPK: ORAL_TABLET | ORAL | 0 refills | Status: AC

## 2019-10-13 NOTE — Discharge Summary (Signed)
Physician Discharge Summary [facility]    Patient: Jared Freeman                   Admit date: 10/09/2019   DOB: 09-Sep-1940             Discharge date:10/13/2019/12:13 PM UEA:[id number]                          PCP: Idelle Crouch, MD  Disposition: HOME with Home health   Recommendations for Outpatient Follow-up:   . Follow up: in 1 week  Discharge Condition: Stable   Code Status:   Code Status: Full Code  Diet recommendation: Regular healthy diet   Discharge Diagnoses:    Active Problems:   Neck pain   Acute hypoxemic respiratory failure (HCC)   Subdural hematoma (HCC)   COPD with acute exacerbation (HCC)   History of Present Illness/ Hospital Course Jared Freeman Summary:   Chief complaint pain shortness of breath. Brief Narrative:  HiramNortonis Y79 y.o.Caucasian malewith a known history of asthma, coronary artery disease, type 2 diabetes mellitus, gout, hypertension and dyslipidemia who presented to the emergency room with acute onset of neck pain mainly on the left side.Per patient wife, patient had a fall 5 -6 days ago, he fell on his right sided chest. He has been having pain in the chest since then. Started neck pain 2 days ago. He had a C-spine CT scan did not show any fracture. He was admitted to the hospital for pain control.  8/30.Patient also had MRI of the cervical spine, showed C4 subdural hematoma. Has been seen by neurosurgery. Continue physical therapy/Occupational Therapy.  Patient was also placed on 4 L oxygen since admission. Per wife, patient has worsening short of breath with exertion for the past year, recently getting worse. He also had a significant cough, nonproductive. He did not have any paroxysmal nocturnal dyspnea or orthopnea, he has significant insomnia. No leg edema. He is treated for COPD exacerbation with steroids and Zithromax. Also maximize COPD treatment.  8/31.  Short of breath getting better, off  oxygen.  Elevated BNP, obtain echocardiogram, Lasix 40 mg IV 1 dose.  9/1/ 2021 - stable today -- wants to go home --- Pt re-evaluated [place]     #1.  Neck pain with a C4 subdural hematoma. Appreciated neurosurgery consult. Neck pain mainly due to muscular trauma, pain is better with Lidoderm patch.  2.  Acute hypoxemic respite failure secondary to COPD exacerbation. Condition improved.  3.  COPD exacerbation. Continue steroids and Zithromax. Condition much improved.  4.  Acute on chronic diastolic congestive heart failure. Patient has elevated BNP, crackles in the base.  We will give a dose of IV Lasix.  Echocardiogram - reviewed-  ejection fraction.  5.  Chronic kidney disease stage IIIa.  Secondary to type 2 diabetes. Condition stable.  6.  Type 2 diabetes. Resume home meds   7.  Essential hypertension. Continue home medicines.  8.  Frequent falls. Continue physical therapy/Occupational Therapy.   Code Status:Full Family Communication:Wife at bedside. Disposition Plan:  Patient came from:Home  Anticipated d/c place:Home w Home health    Consultants:  Neurosurgery  Procedures:None Antimicrobials: Zithromax   Discharge Instructions:   Discharge Instructions    Activity as tolerated - No restrictions   Complete by: As directed    Call MD for:  difficulty breathing, headache or visual disturbances   Complete by: As  directed    Call MD for:  temperature >100.4   Complete by: As directed    Diet - low sodium heart healthy   Complete by: As directed    Discharge instructions   Complete by: As directed    Pls F/up with PCP in 1 wk   Increase activity slowly   Complete by: As directed    Increase activity slowly   Complete by: As directed        Medication List    STOP taking these medications    amitriptyline 25 MG tablet Commonly known as: ELAVIL   amLODipine-benazepril 5-20 MG capsule Commonly known as: LOTREL   aspirin 325 MG tablet     TAKE these medications   acetaminophen 500 MG tablet Commonly known as: TYLENOL Take 1,000 mg by mouth every 6 (six) hours as needed for moderate pain.   allopurinol 300 MG tablet Commonly known as: ZYLOPRIM Take 300 mg by mouth daily.   amLODipine 5 MG tablet Commonly known as: NORVASC Take 1 tablet (5 mg total) by mouth daily. Start taking on: October 14, 2019   azithromycin 500 MG tablet Commonly known as: Zithromax Take 1 tablet (500 mg total) by mouth daily for 5 days. Take 1 tablet daily for 3 days.   escitalopram 10 MG tablet Commonly known as: LEXAPRO TAKE 1 TABLET BY MOUTH EVERY DAY   finasteride 5 MG tablet Commonly known as: PROSCAR Take 1 tablet (5 mg total) by mouth daily. Start taking on: October 14, 2019   hydroxychloroquine 200 MG tablet Commonly known as: PLAQUENIL Take 200 mg by mouth daily.   leflunomide 10 MG tablet Commonly known as: ARAVA Take 10 mg by mouth daily.   methylPREDNISolone 4 MG Tbpk tablet Commonly known as: MEDROL DOSEPAK Medrol Dosepak take as instructed   montelukast 10 MG tablet Commonly known as: SINGULAIR TAKE 1 TABLET BY MOUTH EVERY DAY AT NIGHT   rosuvastatin 20 MG tablet Commonly known as: CRESTOR Take 20 mg by mouth daily.   tamsulosin 0.4 MG Caps capsule Commonly known as: Flomax Take 2 capsules (0.8 mg total) by mouth daily.   traMADol 50 MG tablet Commonly known as: ULTRAM Take 1-2 tablets (50-100 mg total) by mouth every 6 (six) hours as needed for moderate pain. What changed: when to take this       Contact information for after-discharge care    [facility] .   Service: Skilled Nursing Contact information: [address] [place] [phone]                 No Known  Allergies   Procedures /Studies:   CT Angio Neck W and/or Wo Contrast  Result Date: 10/10/2019 CLINICAL DATA:  Initial evaluation for acute left-sided neck pain, recent fall. EXAM: CT ANGIOGRAPHY NECK TECHNIQUE: Multidetector CT imaging of the neck was performed using the standard protocol during bolus administration of intravenous contrast. Multiplanar CT image reconstructions and MIPs were obtained to evaluate the vascular anatomy. Carotid stenosis measurements (when applicable) are obtained utilizing NASCET criteria, using the distal internal carotid diameter as the denominator. CONTRAST:  146mL OMNIPAQUE IOHEXOL 350 MG/ML SOLN COMPARISON:  Comparison made with concomitant CT of the cervical spine. FINDINGS: Aortic arch: Visualized aortic arch of normal caliber with normal branch pattern. Mild-to-moderate atheromatous change about the arch and origin of the great vessels without hemodynamically significant stenosis. Evaluation of the proximal left common and subclavian arteries limited by  streak artifact from adjacent venous contamination. Right carotid system: Right CCA patent from its origin to the bifurcation without stenosis. Scattered mixed plaque about the right bifurcation/proximal right ICA with associated stenosis of up to 40% by NASCET criteria. Right ICA patent distally to the skull base without stenosis, dissection or occlusion. Prominent atherosclerotic change noted within the visualized right carotid siphon. Right external carotid artery and its branches intact without abnormality. Left carotid system: Evaluation of the proximal left CCA limited by adjacent venous contamination. Visualized portions of the left CCA patent without abnormality. A centric plaque at the left bifurcation/proximal left ICA with no more than mild 25% stenosis by NASCET criteria. Left ICA patent distally to the skull base without stenosis, dissection or occlusion. Prominent atherosclerotic change noted within the  visualized left carotid siphon. Left external carotid artery and its branches intact without abnormality. Vertebral arteries: Both vertebral arteries arise from the subclavian arteries. No appreciable proximal subclavian artery stenosis. Vertebral arteries patent within the neck without stenosis, dissection or occlusion. Skeleton: Osseous structures better evaluated on concomitant CT of the cervical spine. No definite acute osseous abnormality. Severe multilevel cervical spondylosis and facet arthrosis noted. No discrete or worrisome osseous lesions. Other neck: No other acute soft tissue abnormality within the neck. No mass lesion or adenopathy. Upper chest: Visualized upper chest better evaluated on concomitant CT of the chest. No definite acute abnormality. IMPRESSION: 1. Negative CTA of the neck with no evidence for acute traumatic vascular injury to the major arterial vasculature of the neck. 2. Atheromatous plaque about the carotid bifurcations/proximal ICAs bilaterally, with associated stenoses of up to 40% on the right and mild 25% on the left. 3. Severe multilevel cervical spondylosis and facet arthrosis, better evaluated on concomitant CT of the cervical spine. Electronically Signed   By: Jeannine Boga M.D.   On: 10/10/2019 01:26   MR CERVICAL SPINE WO CONTRAST  Result Date: 10/10/2019 CLINICAL DATA:  Acute left-sided neck pain after fall 2 days ago. EXAM: MRI CERVICAL SPINE WITHOUT CONTRAST TECHNIQUE: Multiplanar, multisequence MR imaging of the cervical spine was performed. No intravenous contrast was administered. COMPARISON:  CT cervical spine same day. FINDINGS: Alignment: Straightening of the normal cervical lordosis. Trace degenerative anterolisthesis at C7-T1. Vertebrae: No fracture, evidence of discitis, or bone lesion. Cord: Normal signal and morphology. Thin asymmetric heterogeneous fluid signal in the left posterolateral epidural space at C4 (series 8, image 9). Posterior Fossa,  vertebral arteries, paraspinal tissues: Small amount of prevertebral edema. Upper cervical paraspinous soft tissue and muscle edema, greater on the left. Disc levels: C2-C3: Small right-sided posterior disc osteophyte complex. Severe right facet arthropathy. Moderate right neuroforaminal stenosis. No spinal canal or left neuroforaminal stenosis. C3-C4: Mild disc bulging. Severe left facet uncovertebral hypertrophy with small left facet joint effusion. Mild spinal canal stenosis. Severe left neuroforaminal stenosis. No right neuroforaminal stenosis. C4-C5: Interbody and facet ankylosis. Severe bilateral neuroforaminal stenosis. No spinal canal stenosis. C5-C6: Bulky posterior disc osteophyte complex. Severe bilateral facet uncovertebral hypertrophy. Moderate to severe spinal canal stenosis. Severe bilateral neuroforaminal stenosis. C6-C7: Interbody and facet ankylosis. Posterior endplate and uncovertebral spurring. Moderate spinal canal stenosis. Severe left and moderate right neuroforaminal stenosis. C7-T1: Negative disc. Moderate left greater than right facet uncovertebral hypertrophy. Severe bilateral neuroforaminal stenosis. No spinal stenosis. T1-T2: Negative disc.  Mild left facet arthropathy.  No stenosis. IMPRESSION: 1. No acute fracture or cord injury. 2. Thin asymmetric heterogeneous fluid signal in the left posterolateral epidural space at C4 may represent a small amount  of epidural hematoma. Small amount of prevertebral edema. 3. Upper cervical paraspinous soft tissue and muscle edema, greater on the left. 4. Multilevel cervical spondylosis as described above. Moderate to severe spinal canal stenosis at C5-C6. Multilevel severe neuroforaminal stenosis. Electronically Signed   By: Titus Dubin M.D.   On: 10/10/2019 13:14   CT C-SPINE NO CHARGE  Result Date: 10/10/2019 CLINICAL DATA:  Neck pain EXAM: CT CERVICAL SPINE WITHOUT CONTRAST TECHNIQUE: Multiplanar CT image reconstructions were generated  from previously performed CT angiography of the neck. No additional contrast was administered COMPARISON:  None. FINDINGS: Alignment: Straightening of the cervical spine. Trace anterolisthesis C7 on T1. Facet alignment is maintained. Skull base and vertebrae: No fracture is visualized. Soft tissues and spinal canal: No prevertebral fluid or swelling. No visible canal hematoma. Disc levels: Multiple level degenerative change. Severe degenerative changes C4 through C7 with bony ankylosis at these levels. Bulky anterior osteophytes at C5-C6. Effacement of the disc spaces. Posterior disc osteophyte at multiple levels. Advanced facet degenerative change throughout the cervical spine with multiple level bilateral foraminal stenosis, severe at C4-C5, C5-C6 and C6-C7. irregular facet degenerative change on the left at C3-C4. Upper chest: Negative. Other: None IMPRESSION: No definite acute fracture identified. Straightening of the cervical spine with severe degenerative changes from C4 through C7, including marked irregular facet arthropathy on the left side at C3-C4. MRI follow-up may be obtained depending upon the clinical course. See separately dictated CT angiography report for findings regarding the neck vasculature. Electronically Signed   By: Donavan Foil M.D.   On: 10/10/2019 01:14   ECHOCARDIOGRAM COMPLETE  Result Date: 10/12/2019    ECHOCARDIOGRAM REPORT   Patient Name:   MAXTYN NUZUM Tangen Date of Exam: 10/12/2019 Medical Rec #:  [id number]           Height:       65.0 in Accession #:    [id number]          Weight:       213.0 lb Date of Birth:  [birth year]            BSA:          2.032 m Patient Age:    58 years            BP:           139/90 mmHg Patient Gender: M                   HR:           92 bpm. Exam Location:  [facility] Procedure: 2D Echo, Cardiac Doppler and Color Doppler Indications:     CHF- acute diastolic 829.56  History:         Patient has no prior history of Echocardiogram examinations.                   Previous Myocardial Infarction; Risk Factors:Hypertension.  Sonographer:     Sherrie Sport RDCS (AE) Referring Phys:  [id number] Sharen Hones Diagnosing Phys: Bartholome Bill MD IMPRESSIONS  1. Left ventricular ejection fraction, by estimation, is 55 to 60%. The left ventricle has normal function. The left ventricle has no regional wall motion abnormalities. Left ventricular diastolic parameters were normal.  2. Right ventricular systolic function is normal. The right ventricular size is normal. There is normal pulmonary artery systolic pressure.  3. Left atrial size was mildly dilated.  4. The mitral valve is grossly normal. Trivial mitral valve regurgitation.  5. The aortic  valve was not well visualized. Aortic valve regurgitation is trivial. FINDINGS  Left Ventricle: Left ventricular ejection fraction, by estimation, is 55 to 60%. The left ventricle has normal function. The left ventricle has no regional wall motion abnormalities. The left ventricular internal cavity size was normal in size. There is  no left ventricular hypertrophy. Left ventricular diastolic parameters were normal. Right Ventricle: The right ventricular size is normal. No increase in right ventricular wall thickness. Right ventricular systolic function is normal. There is normal pulmonary artery systolic pressure. The tricuspid regurgitant velocity is 2.48 m/s, and  with an assumed right atrial pressure of 10 mmHg, the estimated right ventricular systolic pressure is 25.6 mmHg. Left Atrium: Left atrial size was mildly dilated. Right Atrium: Right atrial size was normal in size. Pericardium: There is no evidence of pericardial effusion. Mitral Valve: The mitral valve is grossly normal. Trivial mitral valve regurgitation. Tricuspid Valve: The tricuspid valve is grossly normal. Tricuspid valve regurgitation is trivial. Aortic Valve: The aortic valve was not well visualized. Aortic valve regurgitation is trivial. Aortic valve mean gradient measures  4.0 mmHg. Aortic valve peak gradient measures 7.1 mmHg. Aortic valve area, by VTI measures 2.77 cm. Pulmonic Valve: The pulmonic valve was not well visualized. Pulmonic valve regurgitation is trivial. Aorta: The aortic root is normal in size and structure. IAS/Shunts: The interatrial septum was not assessed.  LEFT VENTRICLE PLAX 2D LVIDd:         4.50 cm  Diastology LVIDs:         3.11 cm  LV e' lateral:   4.57 cm/s LV PW:         1.67 cm  LV E/e' lateral: 16.0 LV IVS:        1.30 cm  LV e' medial:    6.15 cm/s LVOT diam:     2.10 cm  LV E/e' medial:  11.9 LV SV:         55 LV SV Index:   27 LVOT Area:     3.46 cm  RIGHT VENTRICLE RV Basal diam:  3.88 cm RV S prime:     16.20 cm/s TAPSE (M-mode): 4.7 cm LEFT ATRIUM             Index       RIGHT ATRIUM           Index LA diam:        5.00 cm 2.46 cm/m  RA Area:     19.50 cm LA Vol (A2C):   51.7 ml 25.45 ml/m RA Volume:   51.40 ml  25.30 ml/m LA Vol (A4C):   48.5 ml 23.87 ml/m LA Biplane Vol: 54.8 ml 26.97 ml/m  AORTIC VALVE                   PULMONIC VALVE AV Area (Vmax):    2.24 cm    PV Vmax:        0.75 m/s AV Area (Vmean):   2.03 cm    PV Peak grad:   2.2 mmHg AV Area (VTI):     2.77 cm    RVOT Peak grad: 3 mmHg AV Vmax:           133.50 cm/s AV Vmean:          92.250 cm/s AV VTI:            0.200 m AV Peak Grad:      7.1 mmHg AV Mean Grad:      4.0  mmHg LVOT Vmax:         86.20 cm/s LVOT Vmean:        54.000 cm/s LVOT VTI:          0.160 m LVOT/AV VTI ratio: 0.80  AORTA Ao Root diam: 3.50 cm MITRAL VALVE               TRICUSPID VALVE MV Area (PHT): 4.19 cm    TR Peak grad:   24.6 mmHg MV Decel Time: 181 msec    TR Vmax:        248.00 cm/s MV E velocity: 73.30 cm/s MV A velocity: 74.10 cm/s  SHUNTS MV E/A ratio:  0.99        Systemic VTI:  0.16 m                            Systemic Diam: 2.10 cm Bartholome Bill MD Electronically signed by Bartholome Bill MD Signature Date/Time: 10/12/2019/3:06:39 PM    Final    CT Angio Chest/Abd/Pel for Dissection W  and/or Wo Contrast  Result Date: 10/10/2019 CLINICAL DATA:  79 year old male with fall and right chest pain and back pain. EXAM: CT ANGIOGRAPHY CHEST, ABDOMEN AND PELVIS TECHNIQUE: Non-contrast CT of the chest was initially obtained. Multidetector CT imaging through the chest, abdomen and pelvis was performed using the standard protocol during bolus administration of intravenous contrast. Multiplanar reconstructed images and MIPs were obtained and reviewed to evaluate the vascular anatomy. CONTRAST:  169mL OMNIPAQUE IOHEXOL 350 MG/ML SOLN COMPARISON:  Chest CT dated 01/27/2015. FINDINGS: Evaluation is limited due to streak artifact caused by patient's arms. CTA CHEST FINDINGS Cardiovascular: There is no cardiomegaly or pericardial effusion. There is coronary vascular calcification of the LAD and RCA. There is mild atherosclerotic calcification of the thoracic aorta. No aneurysmal dilatation or dissection. The central pulmonary arteries appear patent. Mediastinum/Nodes: No hilar or mediastinal adenopathy. The esophagus is grossly unremarkable. No mediastinal fluid collection. Lungs/Pleura: Mild emphysema. There are minimal bibasilar dependent atelectasis. No focal consolidation, pleural effusion, or pneumothorax. The central airways are patent Musculoskeletal: Bilateral short L arthroplasties with associated streak artifact. Degenerative changes of the spine. No acute osseous pathology. Review of the MIP images confirms the above findings. CTA ABDOMEN AND PELVIS FINDINGS VASCULAR Aorta: Moderate atherosclerotic calcification of the aorta. There is a 3 cm infrarenal abdominal aortic aneurysm. No dissection. No periaortic inflammation. Celiac: Patent without evidence of aneurysm, dissection, vasculitis or significant stenosis. SMA: Patent without evidence of aneurysm, dissection, vasculitis or significant stenosis. Renals: Both renal arteries are patent without evidence of aneurysm, dissection, vasculitis,  fibromuscular dysplasia or significant stenosis. IMA: Patent without evidence of aneurysm, dissection, vasculitis or significant stenosis. Inflow: Atherosclerotic calcification of the iliac arteries. No aneurysmal dilatation or dissection. Veins: No obvious venous abnormality within the limitations of this arterial phase study. Review of the MIP images confirms the above findings. NON-VASCULAR No intra-abdominal free air or free fluid. Hepatobiliary: No focal liver abnormality is seen. No gallstones, gallbladder wall thickening, or biliary dilatation. Pancreas: Unremarkable. No pancreatic ductal dilatation or surrounding inflammatory changes. Spleen: Normal in size without focal abnormality. Adrenals/Urinary Tract: The adrenal glands unremarkable. Moderate bilateral renal parenchyma atrophy. There is no hydronephrosis on either side. The visualized ureters appear unremarkable. Mildly trabeculated appearance of the bladder wall, likely related to chronic bladder outlet obstruction. Stomach/Bowel: There is sigmoid diverticulosis without active inflammatory changes. There is no bowel obstruction or active inflammation. Appendectomy. Lymphatic: No adenopathy. Reproductive:  Enlarged prostate gland with median lobe hypertrophy measuring approximately 5 cm in transverse diameter. Other: Small fat containing umbilical hernia. Musculoskeletal: Osteopenia with degenerative changes of the spine. No acute osseous pathology. Review of the MIP images confirms the above findings. IMPRESSION: 1. No acute intrathoracic, abdominal, or pelvic pathology. No CT evidence of aortic dissection. 2. A 3 cm infrarenal abdominal aortic aneurysm. Recommend follow-up every 3 years. This recommendation follows ACR consensus guidelines: White Paper of the ACR Incidental Findings Committee II on Vascular Findings. J Am Coll Radiol 2013; 10:789-794. 3. Sigmoid diverticulosis. No bowel obstruction. 4. 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 5. Aortic Atherosclerosis (ICD10-I70.0) and Emphysema (ICD10-J43.9). Electronically Signed   By: Anner Crete M.D.   On: 10/10/2019 01:10    Subjective:   Patient was seen and examined 10/13/2019, 12:13 PM Patient stable today. No acute distress.  No issues overnight Stable for discharge.  Discharge Exam:    Vitals:   10/12/19 1143 10/12/19 2007 10/13/19 0358 10/13/19 0401  BP: 139/90 (!) 133/95 (!) 140/100 (!) 154/96  Pulse: 92 (!) 103 (!) 105 99  Resp:  20 20   Temp: 97.7 F (36.5 C) 99.1 F (37.3 C) 98.4 F (36.9 C)   TempSrc: Oral Oral Oral   SpO2: 95% 96% 91%   Weight:      Height:        General: Pt lying comfortably in bed & appears in no obvious distress. Cardiovascular: S1 & S2 heard, RRR, S1/S2 +. No murmurs, rubs, gallops or clicks. No JVD or pedal edema. Respiratory: Clear to auscultation without wheezing, rhonchi or crackles. No increased work of breathing. Abdominal:  Non-distended, non-tender & soft. No organomegaly or masses appreciated. Normal bowel sounds heard. CNS: Alert and oriented. No focal deficits. Extremities: no edema, no cyanosis    The results of significant diagnostics from this hospitalization (including imaging, microbiology, ancillary and laboratory) are listed below for reference.      Microbiology:   Recent Results (from the past 240 hour(s))  SARS Coronavirus 2 by RT PCR (hospital order, performed in [facility] hospital lab) Nasopharyngeal Nasopharyngeal Swab     Status: None   Collection Time: 10/10/19  2:07 AM   Specimen: Nasopharyngeal Swab  Result Value Ref Range Status   SARS Coronavirus 2 NEGATIVE NEGATIVE Final    Comment: (NOTE) SARS-CoV-2 target nucleic acids are NOT DETECTED.  The SARS-CoV-2 RNA is generally detectable in upper and lower respiratory specimens during the acute phase of infection. The lowest concentration of SARS-CoV-2 viral copies this assay can detect is 250 copies / mL. A negative  result does not preclude SARS-CoV-2 infection and should not be used as the sole basis for treatment or other patient management decisions.  A negative result may occur with improper specimen collection / handling, submission of specimen other than nasopharyngeal swab, presence of viral mutation(s) within the areas targeted by this assay, and inadequate number of viral copies (<250 copies / mL). A negative result must be combined with clinical observations, patient history, and epidemiological information.  Fact Sheet for Patients:   StrictlyIdeas.no  Fact Sheet for Healthcare Providers: BankingDealers.co.za  This test is not yet approved or  cleared by the [place] FDA and has been authorized for detection and/or diagnosis of SARS-CoV-2 by FDA under an Emergency Use Authorization (EUA).  This EUA will remain in effect (meaning this test can be used) for the duration of the COVID-19 declaration under Section 564(b)(1) of the Act, 21 U.S.C.  section 360bbb-3(b)(1), unless the authorization is terminated or revoked sooner.  Performed at [facility], [place] [place], [place] [address]      Labs:   CBC: Recent Labs  Lab 10/10/19 0001 10/10/19 0432 10/13/19 0531  WBC 10.6* 15.4* 12.4*  NEUTROABS 8.9*  --  10.5*  HGB 13.9 12.1* 13.2  HCT 40.7 37.6* 39.7  MCV 90.0 93.1 90.2  PLT 196 167 449   Basic Metabolic Panel: Recent Labs  Lab 10/10/19 0001 10/10/19 0432 10/13/19 0531  NA 138 137 138  K 4.1 4.1 4.1  CL 105 105 102  CO2 22 23 24   GLUCOSE 194* 209* 118*  BUN 20 17 37*  CREATININE 1.43* 1.33* 1.61*  CALCIUM 9.0 8.0* 8.6*  MG  --   --  2.3   Liver Function Tests: No results for input(s): AST, ALT, ALKPHOS, BILITOT, PROT, ALBUMIN in the last 168 hours. BNP (last 3 results) Recent Labs    10/11/19 1258  BNP 498.0*   Cardiac Enzymes: No results for input(s): CKTOTAL, CKMB, CKMBINDEX,  TROPONINI in the last 168 hours. CBG: Recent Labs  Lab 10/12/19 1140 10/12/19 1617 10/12/19 2121 10/13/19 0745 10/13/19 1152  GLUCAP 130* 155* 122* 137* 201*   Hgb A1c No results for input(s): HGBA1C in the last 72 hours. Lipid Profile No results for input(s): CHOL, HDL, LDLCALC, TRIG, CHOLHDL, LDLDIRECT in the last 72 hours. Thyroid function studies No results for input(s): TSH, T4TOTAL, T3FREE, THYROIDAB in the last 72 hours.  Invalid input(s): FREET3 Anemia work up No results for input(s): VITAMINB12, FOLATE, FERRITIN, TIBC, IRON, RETICCTPCT in the last 72 hours. Urinalysis    Component Value Date/Time   COLORURINE YELLOW (A) 02/06/2018 0948   APPEARANCEUR Hazy (A) 06/01/2019 0918   LABSPEC 1.016 02/06/2018 0948   LABSPEC 1.019 05/13/2011 1121   PHURINE 5.0 02/06/2018 0948   GLUCOSEU Negative 06/01/2019 0918   GLUCOSEU Negative 05/13/2011 1121   HGBUR NEGATIVE 02/06/2018 0948   BILIRUBINUR Negative 06/01/2019 0918   BILIRUBINUR Negative 05/13/2011 1121   [place] 02/06/2018 0948   PROTEINUR Negative 06/01/2019 0918   PROTEINUR NEGATIVE 02/06/2018 0948   NITRITE Negative 06/01/2019 0918   NITRITE NEGATIVE 02/06/2018 0948   LEUKOCYTESUR Negative 06/01/2019 0918   LEUKOCYTESUR Negative 05/13/2011 1121         Time coordinating discharge: Over 45 minutes  SIGNED: Deatra James, MD, FACP, FHM. [facility],  Please use [email or web address] to Page If 7PM-7AM, please contact night-coverage [email or web address] Ota [facility] 10/13/2019, 12:13 PM

## 2019-10-13 NOTE — TOC Transition Note (Signed)
Transition of Care [facility]) - CM/SW Discharge Note   Patient Details  Name: Jared Freeman MRN: [id number] Date of Birth: 1940-03-29  Transition of Care [facility]) CM/SW Contact:  Beverly Sessions, RN Phone Number: 10/13/2019, 12:56 PM   Clinical Narrative:     Patient has worked with PT today and improving.   Recommendations have been upgraded to home health.  Patient is in agreement that he wants to go home, an does not want SNF.  Patient states that he does not have a preference of home health agency.   Clint Lipps at  [facility]  Updated patient's wife  Referral made and accepted by [place] with [facility]  RW and BSC to be delivered room prior to discharge by [employer]  Final next level of care: [place] Barriers to Discharge: No Barriers Identified   Patient Goals and CMS Choice        Discharge Placement                       Discharge Plan and Services   Discharge Planning Services: CM Consult            DME Arranged: Gilford Rile rolling, 3-N-1 DME Agency: [employer] Date DME Agency Contacted: 10/13/19   Representative spoke with at DME Agency: Mardene Celeste HH Arranged: RN, PT, OT, Nurse's Aide, Social Work [facility] Agency: [facility] Date [place]: 10/13/19   Representative spoke with at [place]: [place] Interventions     Readmission Risk Interventions No flowsheet data found.

## 2019-10-13 NOTE — Progress Notes (Signed)
Physical Therapy Treatment Patient Details Name: Jared Freeman MRN: [id number] DOB: 13-May-1940 Today's Date: 10/13/2019    History of Present Illness Per MD note:Jared Freeman  is a 79 y.o. Caucasian male with a known history of asthma, coronary artery disease, type 2 diabetes mellitus, gout, hypertension and dyslipidemia who presented to the emergency room with acute onset of neck pain mainly on the left side.  He had an accidental mechanical fall 2 days ago at [place] and his wife believes that he may have twisted his neck.  He stated that he fell on the right hand and hurt his right chest wall.  He denied any head injuries paresthesias or focal muscle weakness during or after the fall.  No headache or dizziness or blurred vision or presyncope.  No nausea or vomiting or abdominal pain.  His wife stated that he has been having recurrent falls throughout the summer due to loss of balance.  He denies chest pain or palpitations.  No cough or wheezing or hemoptysis.  He has been vaccinated for COVID-19.    PT Comments    Pt was sitting EOB upon arriving. He was on rm air with sao2 91%. Was able to stay on rm air throughout session with sao2 >90 %. Stood to [employer] with supervision and ambulated 150 ft without LOB or unsteadiness. Performed ascending/descending 2 stair with +1 rail without difficulty or safety concern. Lengthy discussion with pt/pt's spouse about DC disposition. Pt wants to go home and pt's spouse wants him to go to rehab. Acute PT changing recommendation to home with HHPT to follow. Pain is no longer limiting pt's progression. Pt given gait belt and urinals for home use. MD/CM aware of change in recommendation. He was sitting in recliner with BLEs elevated and call bell in reach.     Follow Up Recommendations  Home health PT;Supervision - Intermittent     Equipment Recommendations  Rolling walker with 5" wheels;3in1 (PT) (pt given gait belt per family request)     Recommendations for Other Services       Precautions / Restrictions Precautions Precautions: Fall Restrictions Weight Bearing Restrictions: No    Mobility  Bed Mobility Overal bed mobility: Needs Assistance             General bed mobility comments: pt was seated EOB upon arriving.  Transfers Overall transfer level: Needs assistance Equipment used: Rolling walker (2 wheeled) Transfers: Sit to/from Stand Sit to Stand: Supervision         General transfer comment: pt was able to STS from EOB to RW with supervision only. no physical assistance or cues required  Ambulation/Gait Ambulation/Gait assistance: Supervision Gait Distance (Feet): 150 Feet Assistive device: Rolling walker (2 wheeled) Gait Pattern/deviations: Step-through pattern Gait velocity: decreased   General Gait Details: pt was able to tolerate ambulation 150 ft with RW without LOB or unsteadiness   Stairs Stairs: Yes Stairs assistance: Min guard Stair Management: One rail Right Number of Stairs: 2 General stair comments: pt has two steps to enter home and requested trial prior to DC. He was easily able to ascend/descend 2 stair with +1 rail.         Cognition Arousal/Alertness: Awake/alert Behavior During Therapy: WFL for tasks assessed/performed Overall Cognitive Status: Within Functional Limits for tasks assessed               General Comments: Pt is A and oriented. pt's significant other in room and wants pt to go to rehab however pt  wants to DC home with HHPT. MD entered room and states he can DC from a medical standpoint             Pertinent Vitals/Pain Pain Assessment: 0-10 Pain Score: 3  Pain Location: back Pain Descriptors / Indicators: Discomfort Pain Intervention(s): Limited activity within patient's tolerance;Premedicated before session;Monitored during session;Repositioned           PT Goals (current goals can now be found in the care plan section) Acute Rehab PT  Goals Patient Stated Goal: to have no pain Progress towards PT goals: Progressing toward goals    Frequency    Min 2X/week      PT Plan Discharge plan needs to be updated       AM-PAC PT "6 Clicks" Mobility   Outcome Measure  Help needed turning from your back to your side while in a flat bed without using bedrails?: A Little Help needed moving from lying on your back to sitting on the side of a flat bed without using bedrails?: A Little Help needed moving to and from a bed to a chair (including a wheelchair)?: A Little Help needed standing up from a chair using your arms (e.g., wheelchair or bedside chair)?: A Little Help needed to walk in hospital room?: A Little Help needed climbing 3-5 steps with a railing? : A Little 6 Click Score: 18    End of Session Equipment Utilized During Treatment: Gait belt Activity Tolerance: Patient tolerated treatment well Patient left: in chair;with call bell/phone within reach;with chair alarm set;with family/visitor present Nurse Communication: Mobility status PT Visit Diagnosis: Pain     Time: 7169-6789 PT Time Calculation (min) (ACUTE ONLY): 31 min  Charges:  $Gait Training: 8-22 mins $Therapeutic Activity: 8-22 mins                     Julaine Fusi PTA 10/13/19, 11:58 AM

## 2019-10-13 NOTE — Progress Notes (Signed)
Patient discharged home. Vital signs stabled. Patient instructions given. Understanding verbalized. Patient discharged via wheelchair.

## 2019-10-18 ENCOUNTER — Encounter: Payer: Self-pay | Admitting: Rheumatology

## 2019-11-30 ENCOUNTER — Encounter: Payer: Medicare Other | Admitting: Urology

## 2019-12-01 ENCOUNTER — Encounter: Payer: Self-pay | Admitting: Urology

## 2020-06-15 IMAGING — MR MR SHOULDER*R* W/O CM
5 series · 40 of 40 positions shown · non-contrast
Comparison: None.

CLINICAL DATA: Shoulder pain and limited range of motion. Remote
history of rotator cuff repair.

EXAM:
MRI OF THE RIGHT SHOULDER WITHOUT CONTRAST
TECHNIQUE: Multiplanar, multisequence MR imaging of the shoulder was performed.
No intravenous contrast was administered.

[Series 3: T2 fat-sat · axial · 4.0mm · 0.59mm/px · z∈[-27,+70]mm · 8 of 23 slices shown (1 of 3)]
[im 1/23]
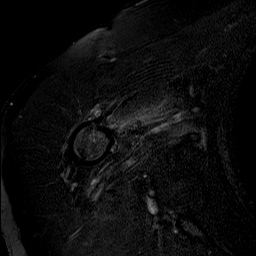
[im 4/23]
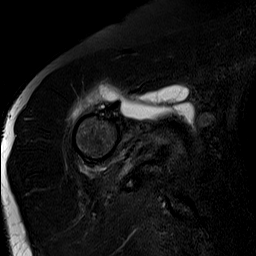
[im 7/23]
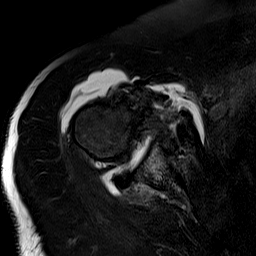
[im 10/23]
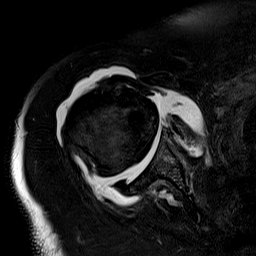
[im 13/23]
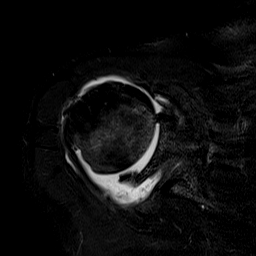
[im 16/23]
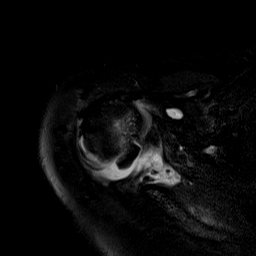
[im 19/23]
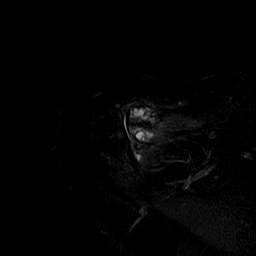
[im 23/23]
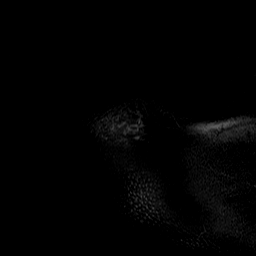

[Series 4: T2 fat-sat · oblique · 4.0mm · 0.59mm/px · 7 of 21 slices shown (2 of 3)]
[im 1/21]
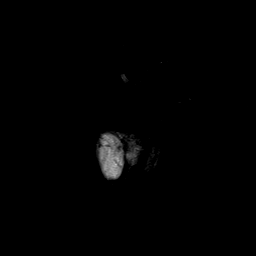
[im 4/21]
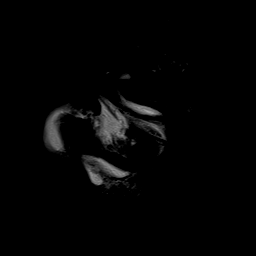
[im 7/21]
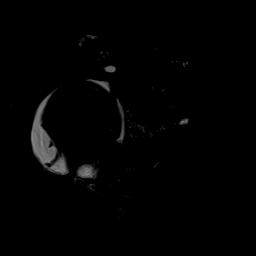
[im 11/21]
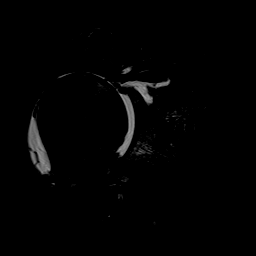
[im 14/21]
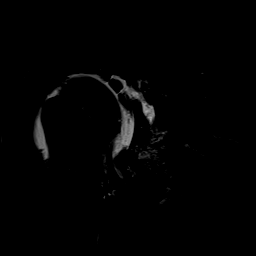
[im 17/21]
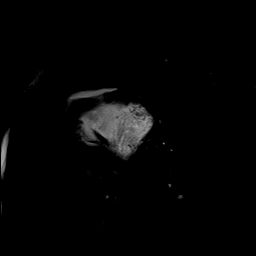
[im 21/21]
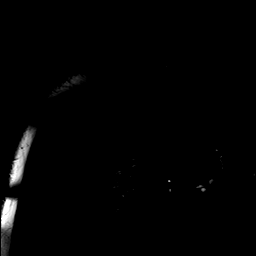

[Series 6: T1 · sagittal · 4.0mm · 0.59mm/px · 9 of 25 slices shown]
[im 1/25]
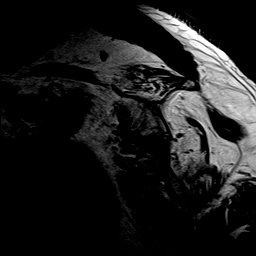
[im 4/25]
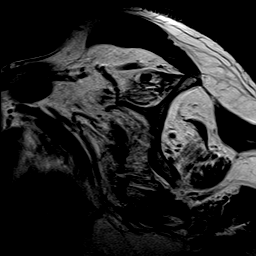
[im 7/25]
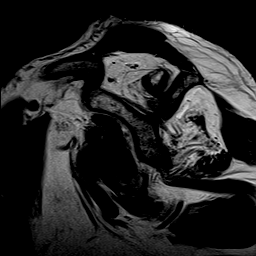
[im 10/25]
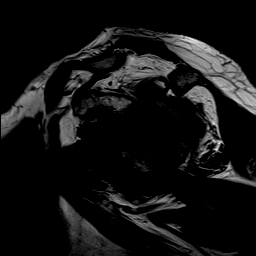
[im 13/25]
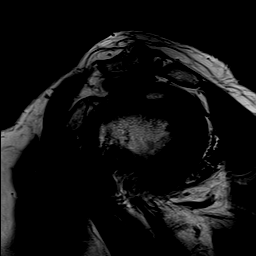
[im 16/25]
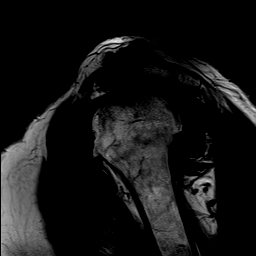
[im 19/25]
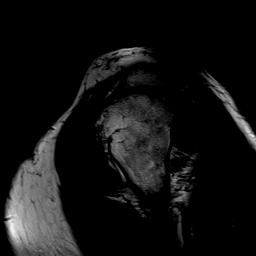
[im 22/25]
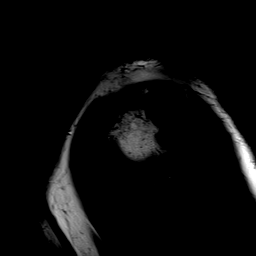
[im 25/25]
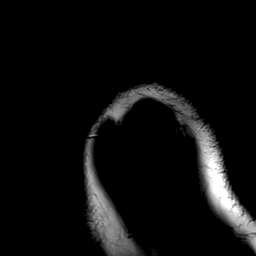

[Series 7: T2 fat-sat · sagittal · 4.0mm · 0.59mm/px · 9 of 25 slices shown (3 of 3)]
[im 1/25]
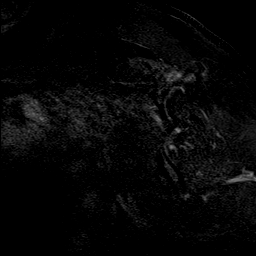
[im 4/25]
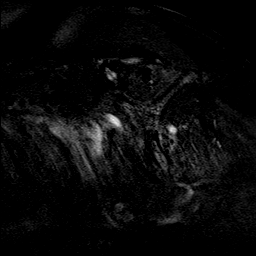
[im 7/25]
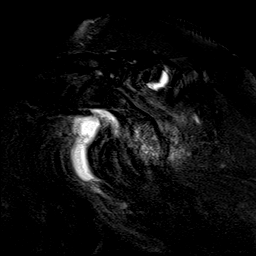
[im 10/25]
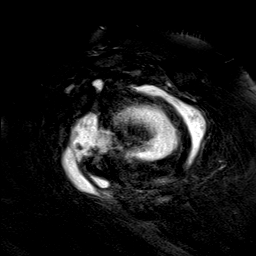
[im 13/25]
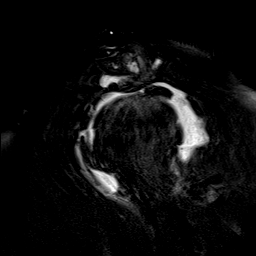
[im 16/25]
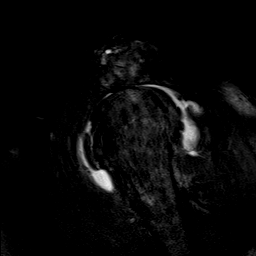
[im 19/25]
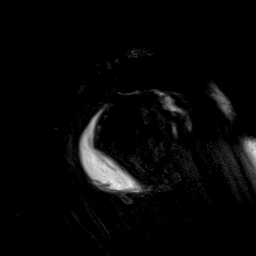
[im 22/25]
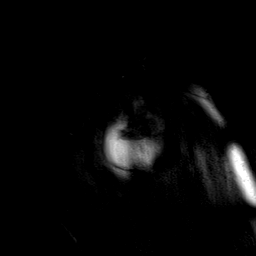
[im 25/25]
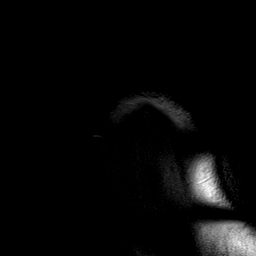

[Series 8: PD · oblique · 4.0mm · 0.59mm/px · 7 of 21 slices shown]
[im 1/21]
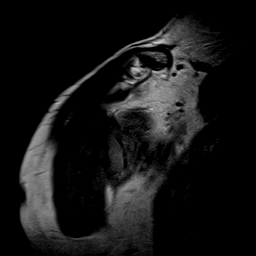
[im 4/21]
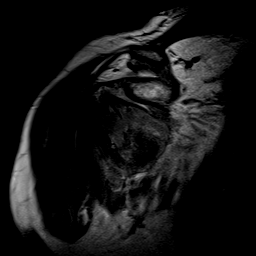
[im 7/21]
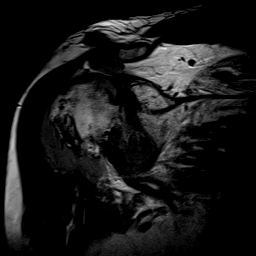
[im 11/21]
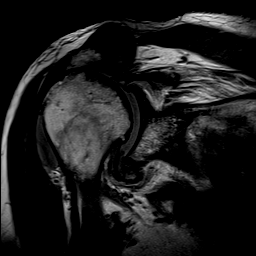
[im 14/21]
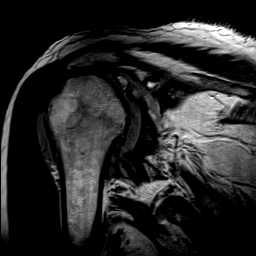
[im 17/21]
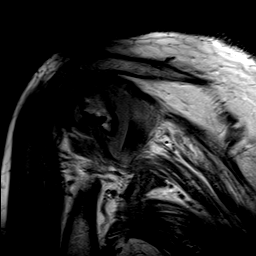
[im 21/21]
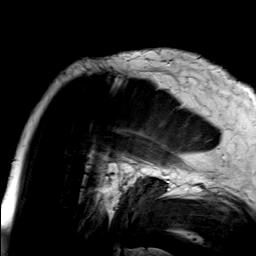

[40 of 40 positions shown; findings below may reference images not displayed]

FINDINGS: Rotator cuff: Large full-thickness retracted rotator cuff tear. This
involves the supraspinatus, infraspinatus and subscapularis tendons.
Maximum retraction of the supraspinatus tendon is 4.3 cm. The
infraspinatus tendon is retracted 2.7 cm and the subscapularis
tendon is retracted 2.2 cm.

Muscles:  Marked fatty atrophy of the rotator cuff muscles.

Biceps long head:  Torn and retracted.

Acromioclavicular Joint: Advanced degenerative changes with superior
and inferior spurring. Type 2 acromion. Mild lateral downsloping and
mild undersurface spurring.

Glenohumeral Joint: Advanced degenerative changes with
full-thickness cartilage loss, joint space narrowing and spurring.
The humeral head is riding high in the glenoid fossa. Moderate
synovitis and scattered loose bodies are noted.

Labrum: The anterior labrum is degenerated and torn. The posterior
labrum and superior labrum appear intact.

Bones:  No acute bony findings.

Other: Expected fluid in the subacromial/subdeltoid bursa.
IMPRESSION: 1. Large full-thickness retracted rotator cuff tear as detailed
above. This is likely chronic given the fatty atrophy of the
muscles.
2. Torn and retracted long head biceps tendon.
3. Degenerated and torn anterior labrum.
4. Advanced glenohumeral joint degenerative changes with large joint
effusion, synovitis and loose bodies.
5. Advanced AC joint degenerative changes with inferior spurring.

## 2020-07-20 IMAGING — CR DG SHOULDER 2+V*L*
4 series · 4 of 4 positions shown · non-contrast
Comparison: None

CLINICAL DATA: Shoulder pain

EXAM:
LEFT SHOULDER - 2+ VIEW

[shoulder y view]
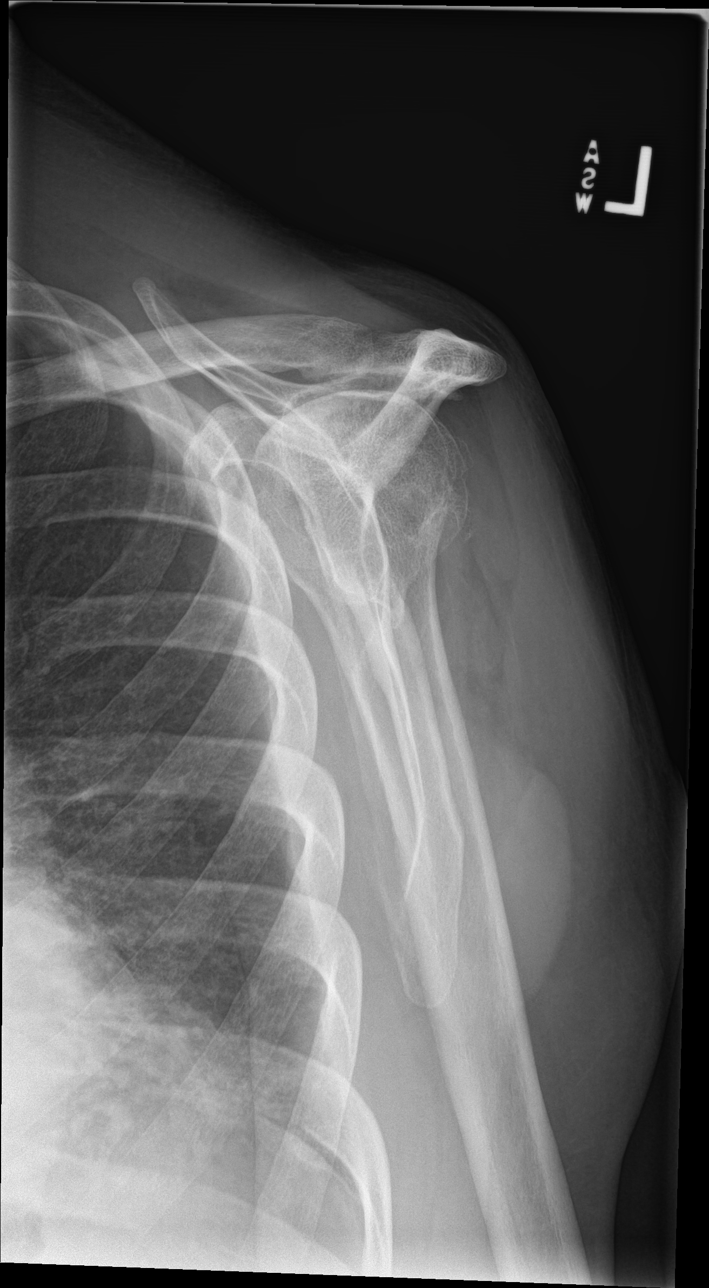

[shoulder grashey (1 of 2)]
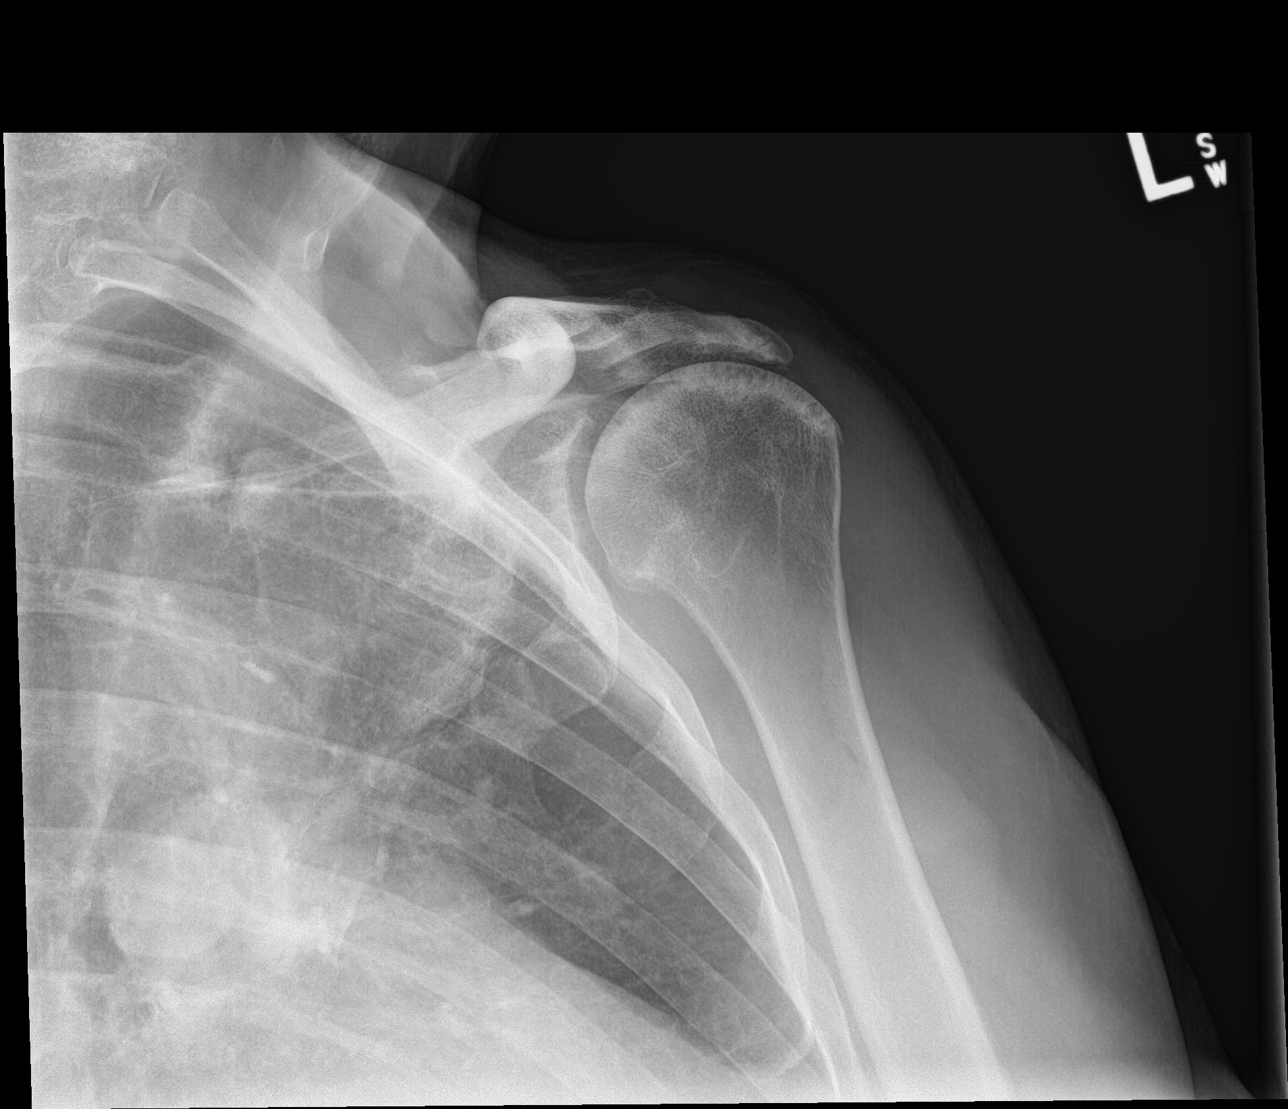

[shoulder grashey (2 of 2)]
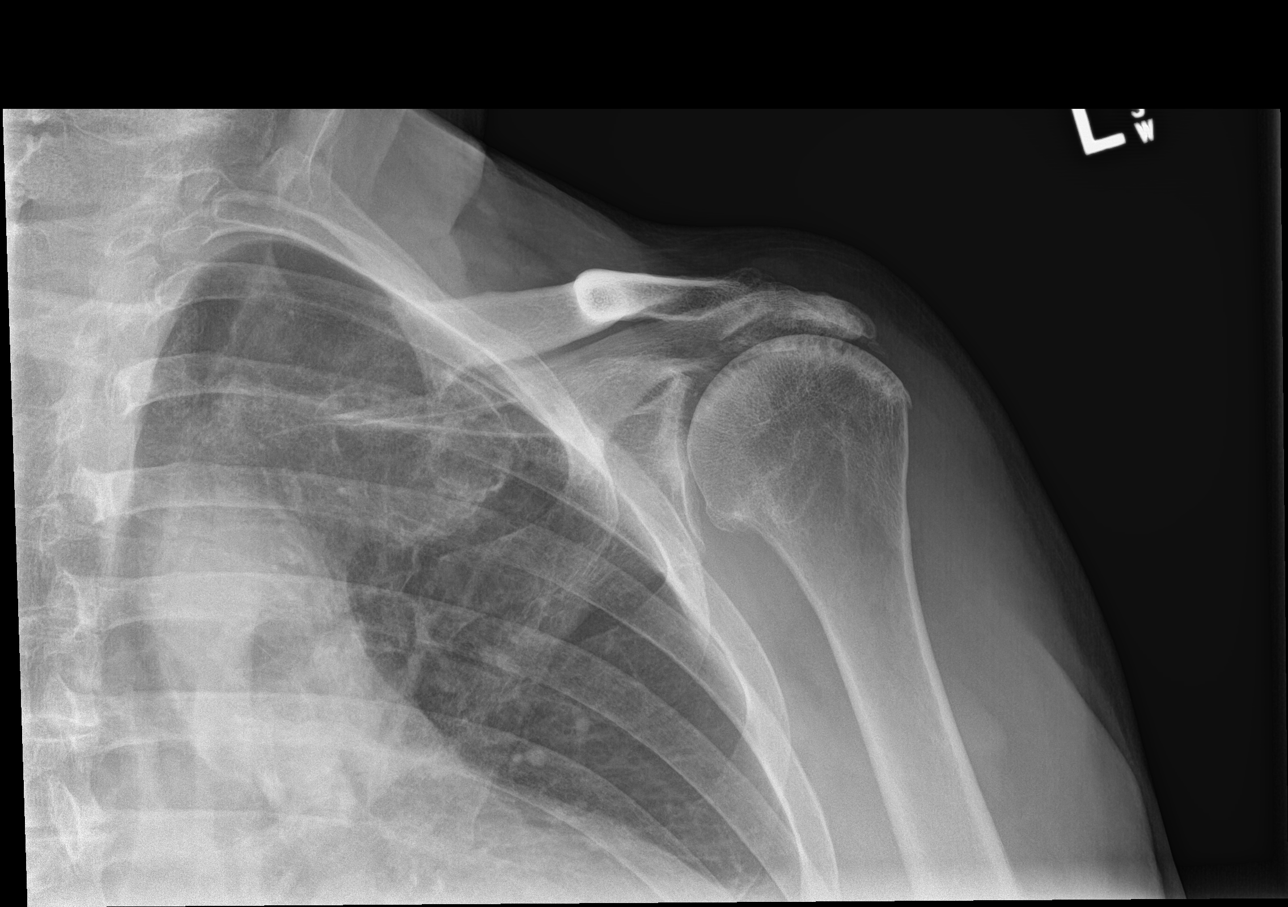

[shoulder axial]
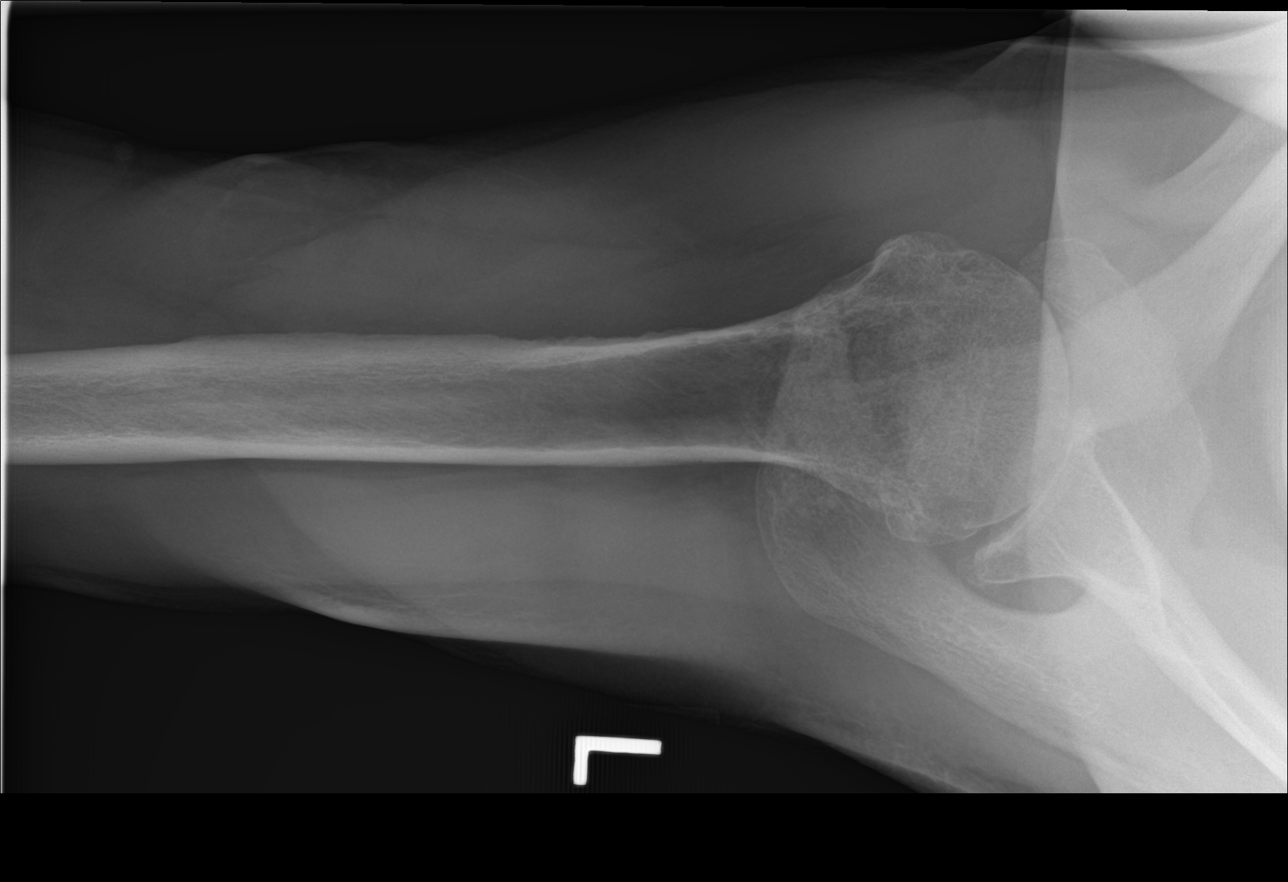

[4 of 4 positions shown; findings below may reference images not displayed]

FINDINGS: No fracture or malalignment. Mild AC joint degenerative change.
Marked narrowing of subacromial space, consistent with rotator cuff
disease. Calcific tendinitis versus small loose bodies superior to
the lateral humeral head. Subchondral sclerosis and small cysts.
Moderate glenohumeral degenerative change.
IMPRESSION: 1. Moderate arthritis of the left shoulder without acute osseous
abnormality
2. Marked narrowing of the subacromial space consistent with rotator
cuff disease.
